# Patient Record
Sex: Male | Born: 1950 | Race: White | Hispanic: No | Marital: Married | Smoking: Former smoker
Health system: Southern US, Community
[De-identification: ages and names within clinical notes are randomized; demographics above are authoritative.]

## PROBLEM LIST (undated history)

## (undated) DIAGNOSIS — Z860100 Personal history of colon polyps, unspecified: Secondary | ICD-10-CM

## (undated) DIAGNOSIS — F419 Anxiety disorder, unspecified: Secondary | ICD-10-CM

## (undated) DIAGNOSIS — K219 Gastro-esophageal reflux disease without esophagitis: Secondary | ICD-10-CM

## (undated) DIAGNOSIS — E785 Hyperlipidemia, unspecified: Secondary | ICD-10-CM

## (undated) DIAGNOSIS — T7840XA Allergy, unspecified, initial encounter: Secondary | ICD-10-CM

## (undated) HISTORY — DX: Anxiety disorder, unspecified: F41.9

## (undated) HISTORY — DX: Gastro-esophageal reflux disease without esophagitis: K21.9

## (undated) HISTORY — PX: CATARACT EXTRACTION W/ INTRAOCULAR LENS  IMPLANT, BILATERAL: SHX1307

## (undated) HISTORY — PX: OTHER SURGICAL HISTORY: SHX169

## (undated) HISTORY — DX: Hyperlipidemia, unspecified: E78.5

## (undated) HISTORY — DX: Personal history of colonic polyps: Z86.010

## (undated) HISTORY — DX: Personal history of colon polyps, unspecified: Z86.0100

## (undated) HISTORY — DX: Allergy, unspecified, initial encounter: T78.40XA

## (undated) HISTORY — PX: VASECTOMY: SHX75

## (undated) HISTORY — PX: EYE SURGERY: SHX253

## (undated) HISTORY — PX: SKIN CANCER EXCISION: SHX779

## (undated) HISTORY — PX: CYST EXCISION: SHX5701

---

## 2005-09-20 ENCOUNTER — Encounter: Payer: Self-pay | Admitting: Internal Medicine

## 2005-12-07 LAB — HM COLONOSCOPY

## 2005-12-18 ENCOUNTER — Encounter: Payer: Self-pay | Admitting: Internal Medicine

## 2008-07-04 ENCOUNTER — Ambulatory Visit: Payer: Self-pay | Admitting: Internal Medicine

## 2008-07-04 DIAGNOSIS — K219 Gastro-esophageal reflux disease without esophagitis: Secondary | ICD-10-CM | POA: Insufficient documentation

## 2008-07-04 DIAGNOSIS — E785 Hyperlipidemia, unspecified: Secondary | ICD-10-CM | POA: Insufficient documentation

## 2008-07-04 DIAGNOSIS — F411 Generalized anxiety disorder: Secondary | ICD-10-CM | POA: Insufficient documentation

## 2008-07-04 DIAGNOSIS — Z860100 Personal history of colon polyps, unspecified: Secondary | ICD-10-CM | POA: Insufficient documentation

## 2008-07-04 DIAGNOSIS — G479 Sleep disorder, unspecified: Secondary | ICD-10-CM | POA: Insufficient documentation

## 2008-07-05 LAB — CONVERTED CEMR LAB
AST: 28 units/L (ref 0–37)
Alkaline Phosphatase: 62 units/L (ref 39–117)
BUN: 12 mg/dL (ref 6–23)
Basophils Absolute: 0 10*3/uL (ref 0.0–0.1)
Basophils Relative: 0.5 % (ref 0.0–3.0)
Bilirubin, Direct: 0.1 mg/dL (ref 0.0–0.3)
CO2: 31 meq/L (ref 19–32)
Calcium: 9.1 mg/dL (ref 8.4–10.5)
Chloride: 104 meq/L (ref 96–112)
Cholesterol: 151 mg/dL (ref 0–200)
Creatinine, Ser: 1 mg/dL (ref 0.4–1.5)
Eosinophils Absolute: 0.1 10*3/uL (ref 0.0–0.7)
GFR calc non Af Amer: 81 mL/min
Glucose, Bld: 95 mg/dL (ref 70–99)
HDL: 23.1 mg/dL — ABNORMAL LOW (ref >39.0)
LDL Cholesterol: 101 mg/dL — ABNORMAL HIGH (ref 0–99)
Lymphocytes Relative: 34.9 % (ref 12.0–46.0)
MCHC: 34.1 g/dL (ref 30.0–36.0)
MCV: 94.1 fL (ref 78.0–100.0)
Neutrophils Relative %: 56.5 % (ref 43.0–77.0)
Platelets: 360 10*3/uL (ref 150–400)
RBC: 4.47 M/uL (ref 4.22–5.81)
RDW: 12.6 % (ref 11.5–14.6)
Total Bilirubin: 0.8 mg/dL (ref 0.3–1.2)
VLDL: 27 mg/dL (ref 0–40)

## 2010-02-20 ENCOUNTER — Ambulatory Visit: Payer: Self-pay | Admitting: Internal Medicine

## 2010-02-21 LAB — CONVERTED CEMR LAB
Glucose, Bld: 96 mg/dL (ref 70–99)
PSA: 0.6 ng/mL (ref 0.10–4.00)

## 2011-01-06 NOTE — Assessment & Plan Note (Signed)
Summary: CPX / LFW   Vital Signs:  Patient profile:   60 year old male Height:      68 inches Weight:      228 pounds BMI:     34.79 Temp:     98.4 degrees F oral Pulse rate:   68 / minute Pulse rhythm:   regular BP sitting:   140 / 80  (left arm) Cuff size:   large  Vitals Entered By: Mervin Hack CMA Duncan Dull) (February 20, 2010 1:55 PM) CC: adult physical   History of Present Illness: Doing fine Has noticed a cyst--started looking like a blackhead--now looks like a cyst No drainage No pain    Allergies (verified): No Known Drug Allergies  Past History:  Past medical, surgical, family and social histories (including risk factors) reviewed for relevance to current acute and chronic problems.  Past Medical History: Reviewed history from 07/04/2008 and no changes required. Anxiety GERD Colonic polyps, hx of Hyperlipidemia--mild  Past Surgical History: Reviewed history from 07/04/2008 and no changes required. 1992 Vasectomy  Family History: Reviewed history from 07/04/2008 and no changes required. Dad died @77   stroke Mom needed pacemaker, HTN 1 brother, 1 sister Breast cancer in sister No colon or prostate cancer No DM, CAD  Social History: Reviewed history from 07/04/2008 and no changes required. 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 Is noisy but no apparent apnea Wears seat belt. Eyes:  Denies blurring and vision loss-1 eye. ENT:  Complains of decreased hearing and ringing in ears; stable hearing problems teeth okay---regular with dentist. CV:  Denies chest pain or discomfort, difficulty breathing at night, difficulty breathing while lying down, fainting, lightheadness, palpitations, and shortness of breath with exertion. Resp:  Denies cough and shortness of breath. GI:  Complains of  indigestion; denies abdominal pain, bloody stools, change in bowel habits, and dark tarry stools; indigestion has been better lately--no meds needed. GU:  Complains of erectile dysfunction and nocturia; denies urinary frequency and urinary hesitancy; nocturia x 1 in general Mild ED. MS:  Denies joint pain and joint swelling. Derm:  Denies lesion(s) and rash; cyst--see HPI. Neuro:  Complains of numbness and tingling; denies headaches and weakness; occ numbness and tingling in big toes---resolves quickly. Psych:  Complains of anxiety; denies depression; gets anxiety a couple of times per week--no more than 2 hours at a time. Has found ways "to push through it". Heme:  Denies abnormal bruising and enlarge lymph nodes. Allergy:  Denies seasonal allergies and sneezing.  Physical Exam  General:  alert and normal appearance.   Eyes:  pupils equal, pupils round, pupils reactive to light, and no optic disk abnormalities.   Ears:  R ear normal and L ear normal.   Mouth:  no erythema and no lesions.   Neck:  supple, no masses, no thyromegaly, no carotid bruits, and no cervical lymphadenopathy.   Lungs:  normal respiratory effort and normal breath sounds.   Heart:  normal rate, regular rhythm, no murmur, and no gallop.   Abdomen:  soft and non-tender.   Rectal:  no hemorrhoids and no masses.   Prostate:  no gland enlargement and no nodules.   Msk:  no joint tenderness and no joint swelling.   Pulses:  normal in feet Extremities:  no edema Neurologic:  alert & oriented X3, strength normal in all extremities, and gait normal.   Skin:  small cyst in lower thoracic back Axillary Nodes:  No palpable lymphadenopathy Psych:  normally interactive, good eye contact, not anxious appearing, and not depressed appearing.     Impression & Recommendations:  Problem # 1:  PREVENTIVE HEALTH CARE (ICD-V70.0) Assessment Comment Only doing well exercising now will check glucose and PSA will try again to get  colonoscopy records---next year will be 5 years so he will need repeat done then regardless  Problem # 2:  GERD (ICD-530.81) Assessment: Improved no meds  Other Orders: TLB-Glucose, QUANT (82947-GLU) Venipuncture (16109) TLB-PSA (Prostate Specific Antigen) (84153-PSA)  Patient Instructions: 1)  Please schedule a follow-up appointment in 1 year.   Prior Medications: None Current Allergies (reviewed today): No known allergies

## 2011-01-06 NOTE — Procedures (Signed)
Summary: Colonoscopy by Dr.Morton [facility]  Colonoscopy by Dr.Morton [facility]   Imported By: Beau Fanny 02/20/2010 15:11:00  _____________________________________________________________________  External Attachment:    Type:   Image     Comment:   External Document

## 2012-11-22 ENCOUNTER — Encounter: Payer: Self-pay | Admitting: Internal Medicine

## 2012-11-23 ENCOUNTER — Ambulatory Visit (INDEPENDENT_AMBULATORY_CARE_PROVIDER_SITE_OTHER): Payer: BC Managed Care – PPO | Admitting: Internal Medicine

## 2012-11-23 ENCOUNTER — Encounter: Payer: Self-pay | Admitting: Internal Medicine

## 2012-11-23 VITALS — BP 140/80 | HR 64 | Temp 98.6°F | Ht 68.0 in | Wt 206.0 lb

## 2012-11-23 DIAGNOSIS — Z23 Encounter for immunization: Secondary | ICD-10-CM

## 2012-11-23 DIAGNOSIS — E785 Hyperlipidemia, unspecified: Secondary | ICD-10-CM

## 2012-11-23 DIAGNOSIS — Z0000 Encounter for general adult medical examination without abnormal findings: Secondary | ICD-10-CM | POA: Insufficient documentation

## 2012-11-23 LAB — CBC WITH DIFFERENTIAL/PLATELET
Basophils Relative: 0.8 % (ref 0.0–3.0)
Eosinophils Absolute: 0.1 10*3/uL (ref 0.0–0.7)
Eosinophils Relative: 1.3 % (ref 0.0–5.0)
HCT: 43.7 % (ref 39.0–52.0)
Lymphs Abs: 1.9 10*3/uL (ref 0.7–4.0)
MCHC: 33.5 g/dL (ref 30.0–36.0)
MCV: 94.5 fl (ref 78.0–100.0)
Monocytes Absolute: 0.4 10*3/uL (ref 0.1–1.0)
Platelets: 299 10*3/uL (ref 150.0–400.0)
RBC: 4.62 Mil/uL (ref 4.22–5.81)
WBC: 5.7 10*3/uL (ref 4.5–10.5)

## 2012-11-23 LAB — BASIC METABOLIC PANEL
BUN: 14 mg/dL (ref 6–23)
CO2: 31 mEq/L (ref 19–32)
Chloride: 103 mEq/L (ref 96–112)
Creatinine, Ser: 0.9 mg/dL (ref 0.4–1.5)
Potassium: 4.8 mEq/L (ref 3.5–5.1)

## 2012-11-23 LAB — HEPATIC FUNCTION PANEL
ALT: 21 U/L (ref 0–53)
AST: 23 U/L (ref 0–37)
Albumin: 4.3 g/dL (ref 3.5–5.2)
Total Protein: 7.2 g/dL (ref 6.0–8.3)

## 2012-11-23 LAB — LIPID PANEL
HDL: 33.6 mg/dL — ABNORMAL LOW (ref >39.00)
Total CHOL/HDL Ratio: 5
Triglycerides: 105 mg/dL (ref 0.0–149.0)

## 2012-11-23 NOTE — Addendum Note (Signed)
Addended by: Sueanne Margarita on: 11/23/2012 12:39 PM   Modules accepted: Orders

## 2012-11-23 NOTE — Assessment & Plan Note (Signed)
Has lost over 20# Will recheck labs

## 2012-11-23 NOTE — Assessment & Plan Note (Signed)
Has gotten in better shape Needs colonoscopy but he wants to check with wife about where to go Will do PSA after discussion Flu vaccine

## 2012-11-23 NOTE — Progress Notes (Signed)
Subjective:    Patient ID: Leroy Harness Sr., male    DOB: 1950-12-18, 61 y.o.   MRN: [id number]  HPI Here for physical Has lost 22# since last visit---eating better and doing more exercise  No new concerns other than cyst on right neck Has gotten a little bigger but has been there for many years  No current outpatient prescriptions on file prior to visit.    No Known Allergies  Past Medical History  Diagnosis Date  . Anxiety   . GERD (gastroesophageal reflux disease)   . Hyperlipidemia   . Personal history of colonic polyps     Past Surgical History  Procedure Date  . Vasectomy     Family History  Problem Relation Age of Onset  . Stroke Father   . Cancer Sister     breast cancer  . CAD Neg Hx   . Diabetes Neg Hx     History   Social History  . Marital Status: Married    Spouse Name: N/A    Number of Children: 4  . Years of Education: N/A   Occupational History  . truss designer    Social History Main Topics  . Smoking status: Former Smoker    Types: Cigarettes    Quit date: 12/07/2005  . Smokeless tobacco: Never Used  . Alcohol Use: Yes  . Drug Use: No  . Sexually Active: Not on file   Other Topics Concern  . Not on file   Social History Narrative  . No narrative on file   Review of Systems  Constitutional: Negative for fatigue.       Wears seat belt  HENT: Positive for hearing loss, rhinorrhea, postnasal drip and tinnitus. Negative for dental problem.        Mild hearing loss Regular with dentist Some allergy symptoms--no meds  Eyes: Negative for visual disturbance.       No diplopia or unilateral vision loss  Respiratory: Negative for cough, chest tightness and shortness of breath.   Cardiovascular: Negative for chest pain, palpitations and leg swelling.  Gastrointestinal: Negative for nausea, vomiting, abdominal pain, constipation and blood in stool.       Occ heartburn---uses tums ~twice a month  Genitourinary: Negative for urgency,  frequency and difficulty urinating.       Occ sl dribbling No sexual problems  Musculoskeletal: Negative for back pain, joint swelling and arthralgias.  Skin: Negative for rash.       No suspicious lesions  Neurological: Positive for numbness. Negative for dizziness, syncope, weakness, light-headedness and headaches.       Slight numbness in great toes---intermittent  Hematological: Negative for adenopathy. Does not bruise/bleed easily.  Psychiatric/Behavioral: Positive for sleep disturbance. Negative for dysphoric mood. The patient is not nervous/anxious.        Chronic sleep problems-- usually no daytime somnolence though. Awakens and its fidgety---relates to stress No meds and doesn't want any       Objective:   Physical Exam  Constitutional: He is oriented to person, place, and time. He appears well-developed and well-nourished. No distress.  HENT:  Head: Normocephalic and atraumatic.  Right Ear: External ear normal.  Left Ear: External ear normal.  Mouth/Throat: Oropharynx is clear and moist. No oropharyngeal exudate.  Eyes: Conjunctivae normal and EOM are normal. Pupils are equal, round, and reactive to light.  Neck: Normal range of motion. Neck supple. No thyromegaly present.  Cardiovascular: Normal rate, regular rhythm, normal heart sounds and intact distal pulses.  Exam reveals no gallop.   No murmur heard. Pulmonary/Chest: Effort normal and breath sounds normal. No respiratory distress. He has no wheezes. He has no rales.  Abdominal: Soft. There is no tenderness.  Musculoskeletal: He exhibits no edema and no tenderness.  Lymphadenopathy:    He has no cervical adenopathy.  Neurological: He is alert and oriented to person, place, and time.  Skin: No rash noted. No erythema.  Psychiatric: He has a normal mood and affect. His behavior is normal.          Assessment & Plan:

## 2012-11-23 NOTE — Patient Instructions (Signed)
You need to have a repeat colonoscopy because of your precancerous polyps on the last one. Please let me know when you decide where you want to go (I can set this up if you need me to)

## 2012-11-28 ENCOUNTER — Encounter: Payer: Self-pay | Admitting: *Deleted

## 2013-04-21 DIAGNOSIS — K635 Polyp of colon: Secondary | ICD-10-CM | POA: Insufficient documentation

## 2013-07-07 ENCOUNTER — Encounter: Payer: Self-pay | Admitting: Internal Medicine

## 2013-07-07 ENCOUNTER — Ambulatory Visit (INDEPENDENT_AMBULATORY_CARE_PROVIDER_SITE_OTHER): Payer: BC Managed Care – PPO | Admitting: Internal Medicine

## 2013-07-07 VITALS — BP 120/70 | HR 66 | Temp 98.1°F | Wt 203.0 lb

## 2013-07-07 DIAGNOSIS — L723 Sebaceous cyst: Secondary | ICD-10-CM

## 2013-07-07 NOTE — Assessment & Plan Note (Signed)
Now bigger and slight discomfort Wants it excised I discussed that this can easily be an office procedure but felt more comfortable referring to a surgeon given its location Will send to [facility]

## 2013-07-07 NOTE — Progress Notes (Signed)
  Subjective:    Patient ID: Leroy Harness Sr., male    DOB: 07/03/1951, 62 y.o.   MRN: [id number]  HPI The right neck cyst is bothering him more People notice Will nick it at times shaving  No red or inflamed No tenderness  No current outpatient prescriptions on file prior to visit.   No current facility-administered medications on file prior to visit.    No Known Allergies  Past Medical History  Diagnosis Date  . Anxiety   . GERD (gastroesophageal reflux disease)   . Hyperlipidemia   . Personal history of colonic polyps     Past Surgical History  Procedure Laterality Date  . Vasectomy      Family History  Problem Relation Age of Onset  . Stroke Father   . Cancer Sister     breast cancer  . CAD Neg Hx   . Diabetes Neg Hx     History   Social History  . Marital Status: Married    Spouse Name: N/A    Number of Children: 4  . Years of Education: N/A   Occupational History  . truss designer    Social History Main Topics  . Smoking status: Former Smoker    Types: Cigarettes    Quit date: 12/07/2005  . Smokeless tobacco: Never Used  . Alcohol Use: Yes  . Drug Use: No  . Sexually Active: Not on file   Other Topics Concern  . Not on file   Social History Narrative  . No narrative on file   Review of Systems Has lost 3# more Feels well No fever    Objective:   Physical Exam  Constitutional: He appears well-developed and well-nourished. No distress.  Neck:  2.5-3 cm right neck cyst--overlying SCM body Fixed to skin but not underlying tissues No inflammation          Assessment & Plan:

## 2013-07-24 ENCOUNTER — Ambulatory Visit (INDEPENDENT_AMBULATORY_CARE_PROVIDER_SITE_OTHER): Payer: BC Managed Care – PPO | Admitting: General Surgery

## 2013-07-24 ENCOUNTER — Encounter: Payer: Self-pay | Admitting: General Surgery

## 2013-07-24 VITALS — BP 130/72 | HR 74 | Resp 12 | Ht 68.0 in | Wt 207.0 lb

## 2013-07-24 DIAGNOSIS — L723 Sebaceous cyst: Secondary | ICD-10-CM

## 2013-07-24 DIAGNOSIS — L729 Follicular cyst of the skin and subcutaneous tissue, unspecified: Secondary | ICD-10-CM | POA: Insufficient documentation

## 2013-07-24 NOTE — Progress Notes (Signed)
Patient ID: Leroy Harness Sr., male   DOB: 1951-01-11, 62 y.o.   MRN: [id number]  Chief Complaint  Patient presents with  . Cyst    HPI Leroy HARI Sr. is a 62 y.o. male referred here by Dr Alphonsus Sias for a cyst on his neck. He first noticed this about 3 years ago. It has been enlarging in the last 6 months. He reports no pain from the cyst on the right side of his neck. HPI  Past Medical History  Diagnosis Date  . Anxiety   . GERD (gastroesophageal reflux disease)   . Hyperlipidemia   . Personal history of colonic polyps     Past Surgical History  Procedure Laterality Date  . Vasectomy    . Cyst excision  30 years ago    from back    Family History  Problem Relation Age of Onset  . Stroke Father   . Breast cancer Sister     breast cancer  . CAD Neg Hx   . Diabetes Neg Hx     Social History History  Substance Use Topics  . Smoking status: Former Smoker    Types: Cigarettes    Quit date: 12/07/2005  . Smokeless tobacco: Never Used  . Alcohol Use: Yes    Allergies  Allergen Reactions  . Shellfish Allergy     No current outpatient prescriptions on file.   No current facility-administered medications for this visit.    Review of Systems Review of Systems  Constitutional: Negative.   Respiratory: Negative.   Cardiovascular: Negative.     Blood pressure 130/72, pulse 74, resp. rate 12, height 5\' 8"  (1.727 m), weight 207 lb (93.895 kg).  Physical Exam Physical Exam  Constitutional: He is oriented to person, place, and time. He appears well-developed and well-nourished.  Neck: Neck supple.  2 cm cutaneous cyst right side of neck posterior. Firm and mildly tender. No redness or skin induration.  Lymphadenopathy:    He has no cervical adenopathy.  Neurological: He is alert and oriented to person, place, and time.    Data Reviewed none  Assessment    Enlarging symptomatic skin cyst     Plan    Recommend excision. Discussed fully with patient.  Patient in agreement.     Procedure: Excision skin cyst right lateral neck area. Prep: chloroprep Anesthetic: 1% xylocaine mixed with 0.5% marcaine, 10ml used.   After the cyst area was draped out, transverse elliptical incision made and the cyst excised out fully from underlying subcutaneous tissue.  Bleeding controlled with disposable cautery.  Closed with 3-0 vicryl in sunbcutaneous tissue. Skin closed woth 4-0 nylon interrupted stitches. Dressed with neosporin, telfa and tegaderm. No immediate problems from procedure.    Shavonta Gossen G 07/26/2013, 6:22 AM

## 2013-07-24 NOTE — Patient Instructions (Addendum)
Cyst Removal Your caregiver has removed a cyst. A cyst is a sac containing a semi-solid material. Cysts may occur any place on your body. They may remain small for years or gradually get larger. A sebaceous cyst is an enlarged (dilated) sweat gland filled with old sweat (sebum). Unattended, these may become large (the size of a softball) over several years time. These are often removed for improved appearance (cosmetic) reasons or before they become infected to form an abscess. An abscess is an infected cyst. HOME CARE INSTRUCTIONS   Keep your bandage clean and dry. You may change your bandage after 24 hours. If your bandage sticks, use warm water to gently loosen it. Pat the area dry with a clean towel before putting on another bandage.  If possible, keep the area where the cyst was removed raised to relieve soreness, swelling, and promote healing.  If you have stitches, keep them clean and dry.  You may clean your stitches gently with a cotton swab dipped in warm soapy water.  Do not soak the area where the cyst was removed or go swimming. You may shower.  Do not overuse the area where your cyst was removed.  Return in 7 days or as directed to have your stitches removed.  Take medicines as instructed by your caregiver. SEEK IMMEDIATE MEDICAL CARE IF:   An oral temperature above 102 F (38.9 C) develops, not controlled by medication.  Blood continues to soak through the bandage.  You have increasing pain in the area where your cyst was removed.  You have redness, swelling, pus, a bad smell, soreness (inflammation), or red streaks coming away from the stitches. These are signs of infection. MAKE SURE YOU:   Understand these instructions.  Will watch your condition.  Will get help right away if you are not doing well or get worse. Document Released: 11/20/2000 Document Revised: 02/15/2012 Document Reviewed: 03/15/2008 [facility] Patient Information 2014 [place], [place].  Ice pack  applied. Written instructions provided to patient regarding wound care.  Patient advised that he will be contacted by phone when the pathology report is available.  Follow up appointment has been scheduled with the nurse in 7-10 days.

## 2013-07-26 ENCOUNTER — Encounter: Payer: Self-pay | Admitting: General Surgery

## 2013-07-26 LAB — PATHOLOGY

## 2013-07-31 ENCOUNTER — Ambulatory Visit: Payer: BC Managed Care – PPO | Admitting: *Deleted

## 2013-07-31 DIAGNOSIS — L729 Follicular cyst of the skin and subcutaneous tissue, unspecified: Secondary | ICD-10-CM

## 2013-07-31 NOTE — Patient Instructions (Signed)
The sutures were removed and steri strips applied to the right side of his neck. Looks clean and healing well.

## 2013-10-12 ENCOUNTER — Other Ambulatory Visit: Payer: Self-pay

## 2013-12-24 ENCOUNTER — Emergency Department: Payer: Self-pay | Admitting: Emergency Medicine

## 2014-01-19 ENCOUNTER — Ambulatory Visit (INDEPENDENT_AMBULATORY_CARE_PROVIDER_SITE_OTHER): Payer: PRIVATE HEALTH INSURANCE | Admitting: Internal Medicine

## 2014-01-19 ENCOUNTER — Encounter: Payer: Self-pay | Admitting: Internal Medicine

## 2014-01-19 VITALS — BP 128/60 | HR 54 | Temp 98.4°F | Ht 68.0 in | Wt 191.0 lb

## 2014-01-19 DIAGNOSIS — G479 Sleep disorder, unspecified: Secondary | ICD-10-CM

## 2014-01-19 DIAGNOSIS — Z0000 Encounter for general adult medical examination without abnormal findings: Secondary | ICD-10-CM

## 2014-01-19 NOTE — Assessment & Plan Note (Signed)
Healthy Has really done well with fitness and weight loss Will defer labs to next year

## 2014-01-19 NOTE — Progress Notes (Signed)
Pre-visit discussion using our clinic review tool. No additional management support is needed unless otherwise documented below in the visit note.  

## 2014-01-19 NOTE — Progress Notes (Signed)
Subjective:    Patient ID: Leroy Givens Sr., male    DOB: 1951/10/01, 63 y.o.   MRN: [id number]  HPI Here for physical  Has continued to work on lifestyle Now running 6-7 miles per week Trying to eat right Has lost another 16#  Had colonoscopy in May Had some small polyps  Still has a couple of spots his wife is concerned about  No current outpatient prescriptions on file prior to visit.   No current facility-administered medications on file prior to visit.    Allergies  Allergen Reactions  . Shellfish Allergy     Past Medical History  Diagnosis Date  . Anxiety   . GERD (gastroesophageal reflux disease)   . Hyperlipidemia   . Personal history of colonic polyps     Past Surgical History  Procedure Laterality Date  . Vasectomy    . Cyst excision  30 years ago    from back    Family History  Problem Relation Age of Onset  . Stroke Father   . Breast cancer Sister     breast cancer  . CAD Neg Hx   . Diabetes Neg Hx     History   Social History  . Marital Status: Married    Spouse Name: N/A    Number of Children: 4  . Years of Education: N/A   Occupational History  . truss designer    Social History Main Topics  . Smoking status: Former Smoker    Types: Cigarettes    Quit date: 12/07/2005  . Smokeless tobacco: Never Used  . Alcohol Use: Yes  . Drug Use: No  . Sexual Activity: Not on file   Other Topics Concern  . Not on file   Social History Narrative  . No narrative on file   Review of Systems  Constitutional: Negative for fatigue.       Wears seat belt  HENT: Positive for hearing loss and tinnitus.        Mild hearing loss Regular with dentist  Eyes: Positive for visual disturbance.       No diplopia or unilateral vision loss Thinks his cataracts are worse  Respiratory: Negative for cough, chest tightness and shortness of breath.   Cardiovascular: Negative for chest pain, palpitations and leg swelling.  Gastrointestinal:  Negative for nausea, vomiting, abdominal pain, constipation and blood in stool.       No heartburn  Endocrine: Negative for cold intolerance and heat intolerance.  Genitourinary: Positive for difficulty urinating. Negative for urgency and frequency.       Slight dribbling only Mild ED  Musculoskeletal: Negative for arthralgias, back pain and joint swelling.  Skin: Negative for rash.  Allergic/Immunologic: Positive for environmental allergies. Negative for immunocompromised state.       Mild allergy symptoms --no meds  Neurological: Positive for numbness. Negative for dizziness, syncope, weakness, light-headedness and headaches.       Some numbness in both great toes  Hematological: Negative for adenopathy. Does not bruise/bleed easily.  Psychiatric/Behavioral: Positive for sleep disturbance. Negative for dysphoric mood. The patient is nervous/anxious.        Chronic sleep problems---slightly better. No usual daytime somnolence Some anxiety---like from stress with company being bought out       Objective:   Physical Exam  Constitutional: He is oriented to person, place, and time. He appears well-developed and well-nourished. No distress.  HENT:  Head: Normocephalic and atraumatic.  Right Ear: External ear normal.  Left Ear:  External ear normal.  Mouth/Throat: Oropharynx is clear and moist. No oropharyngeal exudate.  Eyes: Conjunctivae and EOM are normal. Pupils are equal, round, and reactive to light.  Neck: Normal range of motion. Neck supple. No thyromegaly present.  Cardiovascular: Normal rate, regular rhythm, normal heart sounds and intact distal pulses.  Exam reveals no gallop.   No murmur heard. Pulmonary/Chest: Effort normal and breath sounds normal. No respiratory distress. He has no wheezes. He has no rales.  Abdominal: Soft. There is no tenderness.  Musculoskeletal: He exhibits no edema and no tenderness.  Lymphadenopathy:    He has no cervical adenopathy.  Neurological:  He is alert and oriented to person, place, and time.  Skin: No rash noted. No erythema.  Cherry angiomas, seb keratoses, tags  Psychiatric: He has a normal mood and affect. His behavior is normal.          Assessment & Plan:

## 2014-01-19 NOTE — Assessment & Plan Note (Signed)
Has improved Discussed melatonin prn

## 2015-01-24 ENCOUNTER — Ambulatory Visit (INDEPENDENT_AMBULATORY_CARE_PROVIDER_SITE_OTHER): Payer: PPO | Admitting: Internal Medicine

## 2015-01-24 ENCOUNTER — Encounter: Payer: Self-pay | Admitting: Internal Medicine

## 2015-01-24 VITALS — BP 136/82 | HR 48 | Temp 98.2°F | Ht 68.0 in | Wt 191.8 lb

## 2015-01-24 DIAGNOSIS — Z23 Encounter for immunization: Secondary | ICD-10-CM

## 2015-01-24 DIAGNOSIS — Z0000 Encounter for general adult medical examination without abnormal findings: Secondary | ICD-10-CM

## 2015-01-24 DIAGNOSIS — K219 Gastro-esophageal reflux disease without esophagitis: Secondary | ICD-10-CM

## 2015-01-24 DIAGNOSIS — R351 Nocturia: Secondary | ICD-10-CM

## 2015-01-24 DIAGNOSIS — G479 Sleep disorder, unspecified: Secondary | ICD-10-CM

## 2015-01-24 DIAGNOSIS — Z7189 Other specified counseling: Secondary | ICD-10-CM | POA: Insufficient documentation

## 2015-01-24 MED ORDER — ZOSTER VACCINE LIVE 19400 UNT/0.65ML ~~LOC~~ SOLR: 0.6500 mL | Freq: Once | SUBCUTANEOUS | Status: DC

## 2015-01-24 NOTE — Assessment & Plan Note (Signed)
Has not seemed to have any problems lately

## 2015-01-24 NOTE — Assessment & Plan Note (Signed)
This has been quiet Rarely needs the tums

## 2015-01-24 NOTE — Assessment & Plan Note (Addendum)
I have personally reviewed the Medicare Annual Wellness questionnaire and have noted 1. The patient's medical and social history 2. Their use of alcohol, tobacco or illicit drugs 3. Their current medications and supplements 4. The patient's functional ability including ADL's, fall risks, home safety risks and hearing or visual             impairment. 5. Diet and physical activities 6. Evidence for depression or mood disorders  The patients weight, height, BMI and visual acuity have been recorded in the chart I have made referrals, counseling and provided education to the patient based review of the above and I have provided the pt with a written personalized care plan for preventive services.  I have provided you with a copy of your personalized plan for preventive services. Please take the time to review along with your updated medication list.  Will give prevnar Recommended flu vaccine in fall UTD with colonoscopy Will check PSA if covered Rx for zostavax

## 2015-01-24 NOTE — Progress Notes (Signed)
Pre visit review using our clinic review tool, if applicable. No additional management support is needed unless otherwise documented below in the visit note. 

## 2015-01-24 NOTE — Assessment & Plan Note (Signed)
See social history 

## 2015-01-24 NOTE — Progress Notes (Signed)
Subjective:    Patient ID: Leroy Givens Sr., male    DOB: 11-19-1951, 64 y.o.   MRN: [id number]  HPI Here for Medicare wellness visit and follow up of chronic issues Reviewed incomplete form and advanced directives Sees eye doctor and dentist-- Dr Delice Bison No falls No depression or anhedonia Vision is fine--better since cataracts Mild hearing loss--slow decline Independent in instrumental ADLs Just had cataracts done--no other procedures No medications No sig memory problems No tobacco Drinks 3-4 beers per week Still runs and goes to gym  No recent problems with anxiety  GERD has been fairly quiet Will use occasional tums No regular heartburn No swallowing problems No chest pain No SOB No dizziness or syncope  No sig problems with voiding Stream okay Nocturia is variable No sig ED  Sleeps okay No meds for this now  No current outpatient prescriptions on file prior to visit.   No current facility-administered medications on file prior to visit.    Allergies  Allergen Reactions  . Shellfish Allergy     Past Medical History  Diagnosis Date  . Anxiety   . GERD (gastroesophageal reflux disease)   . Hyperlipidemia   . Personal history of colonic polyps     Past Surgical History  Procedure Laterality Date  . Vasectomy    . Cyst excision  30 years ago    from back  . Cataract extraction w/ intraocular lens  implant, bilateral  12/15-R  1/16-- L    Family History  Problem Relation Age of Onset  . Stroke Father   . Breast cancer Sister     breast cancer  . CAD Neg Hx   . Diabetes Neg Hx     History   Social History  . Marital Status: Married    Spouse Name: N/A  . Number of Children: 4  . Years of Education: N/A   Occupational History  . 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 Smoking status: Former Smoker    Types: Cigarettes    Quit date: 12/07/2005  . Smokeless tobacco: Never  Used  . Alcohol Use: Yes  . Drug Use: No  . Sexual Activity: Not on file   Other Topics Concern  . Not on file   Social History Narrative   No living will   Working on health care POA--- would want wife   Would accept resuscitation   No tube feeds if cognitively unaware   Review of Systems Appetite is fine Did have his weight down to 175#--- now up some with the winter, vacation, etc Bowels are fine Teeth are fine No skin problems or rash    Objective:   Physical Exam  Constitutional: He is oriented to person, place, and time. He appears well-developed and well-nourished. No distress.  HENT:  Mouth/Throat: Oropharynx is clear and moist. No oropharyngeal exudate.  Neck: Normal range of motion. Neck supple. No thyromegaly present.  Cardiovascular: Normal rate, regular rhythm, normal heart sounds and intact distal pulses.  Exam reveals no gallop.   No murmur heard. Pulmonary/Chest: Effort normal and breath sounds normal. No respiratory distress. He has no wheezes. He has no rales.  Abdominal: Soft. There is no tenderness.  Musculoskeletal: He exhibits no edema or tenderness.  Lymphadenopathy:    He has no cervical adenopathy.  Neurological: He is alert and oriented to person, place, and time.  President-- "Obama, Tawni Pummel, Clinton" [phone] D-l-r-o-w Recall  3/3  Skin: No rash noted. No erythema.  Psychiatric: He has a normal mood and affect. His behavior is normal.          Assessment & Plan:

## 2015-01-25 LAB — GLUCOSE, RANDOM: Glucose, Bld: 84 mg/dL (ref 70–99)

## 2015-01-25 LAB — PSA: PSA: 0.49 ng/mL (ref 0.10–4.00)

## 2015-01-28 NOTE — Addendum Note (Signed)
Addended by: Jacqualin Combes on: 01/28/2015 08:24 AM   Modules accepted: Orders

## 2015-03-05 ENCOUNTER — Telehealth: Payer: Self-pay

## 2015-03-05 NOTE — Telephone Encounter (Signed)
Voice mailbox not set up, no way to leave message about flu vaccine

## 2016-04-29 ENCOUNTER — Ambulatory Visit (INDEPENDENT_AMBULATORY_CARE_PROVIDER_SITE_OTHER): Payer: PPO

## 2016-04-29 VITALS — BP 140/78 | HR 48 | Temp 98.4°F | Ht 67.5 in | Wt 197.5 lb

## 2016-04-29 DIAGNOSIS — Z125 Encounter for screening for malignant neoplasm of prostate: Secondary | ICD-10-CM | POA: Diagnosis not present

## 2016-04-29 DIAGNOSIS — Z1159 Encounter for screening for other viral diseases: Secondary | ICD-10-CM

## 2016-04-29 DIAGNOSIS — Z23 Encounter for immunization: Secondary | ICD-10-CM | POA: Diagnosis not present

## 2016-04-29 DIAGNOSIS — Z0000 Encounter for general adult medical examination without abnormal findings: Secondary | ICD-10-CM

## 2016-04-29 DIAGNOSIS — Z8639 Personal history of other endocrine, nutritional and metabolic disease: Secondary | ICD-10-CM

## 2016-04-29 NOTE — Patient Instructions (Signed)
Mr. Folan , Thank you for taking time to come for your Medicare Wellness Visit. I appreciate your ongoing commitment to your health goals. Please review the following plan we discussed and let me know if I can assist you in the future.   These are the goals we discussed: Goals    . Weight < 180 lb (81.647 kg)     Starting 04/29/2016, I will continue to eat 3 meals with snacks, minimize consumption of greasy, fried foods, limit intake of red meat and pork, limit intake of simple carbs, increase intake of fruits and vegetables, and try to stop eating after 8PM in an effort to lose 15-20 lbs.        This is a list of the screening recommended for you and due dates:  Health Maintenance  Topic Date Due  . Flu Shot  07/07/2016  . Colon Cancer Screening  04/22/2023  . Tetanus Vaccine  01/01/2024  . Shingles Vaccine  Completed  .  Hepatitis C: One time screening is recommended by Center for Disease Control  (CDC) for  adults born from [birth year] through 1967.   Completed  . Pneumonia vaccines  Completed   Preventive Care for Adults  A healthy lifestyle and preventive care can promote health and wellness. Preventive health guidelines for adults include the following key practices.  . A routine yearly physical is a good way to check with your health care provider about your health and preventive screening. It is a chance to share any concerns and updates on your health and to receive a thorough exam.  . Visit your dentist for a routine exam and preventive care every 6 months. Brush your teeth twice a day and floss once a day. Good oral hygiene prevents tooth decay and gum disease.  . The frequency of eye exams is based on your age, health, family medical history, use  of contact lenses, and other factors. Follow your health care provider's ecommendations for frequency of eye exams.  . Eat a healthy diet. Foods like vegetables, fruits, whole grains, low-fat dairy products, and lean protein foods  contain the nutrients you need without too many calories. Decrease your intake of foods high in solid fats, added sugars, and salt. Eat the right amount of calories for you. Get information about a proper diet from your health care provider, if necessary.  . Regular physical exercise is one of the most important things you can do for your health. Most adults should get at least 150 minutes of moderate-intensity exercise (any activity that increases your heart rate and causes you to sweat) each week. In addition, most adults need muscle-strengthening exercises on 2 or more days a week.  Silver Sneakers may be a benefit available to you. To determine eligibility, you may visit the website: www.silversneakers.com or contact program at 972-038-7063 Mon-Fri between 8AM-8PM.   . Maintain a healthy weight. The body mass index (BMI) is a screening tool to identify possible weight problems. It provides an estimate of body fat based on height and weight. Your health care provider can find your BMI and can help you achieve or maintain a healthy weight.   For adults 20 years and older: ? A BMI below 18.5 is considered underweight. ? A BMI of 18.5 to 24.9 is normal. ? A BMI of 25 to 29.9 is considered overweight. ? A BMI of 30 and above is considered obese.   . Maintain normal blood lipids and cholesterol levels by exercising and minimizing your intake  of saturated fat. Eat a balanced diet with plenty of fruit and vegetables. Blood tests for lipids and cholesterol should begin at age 62 and be repeated every 5 years. If your lipid or cholesterol levels are high, you are over 50, or you are at high risk for heart disease, you may need your cholesterol levels checked more frequently. Ongoing high lipid and cholesterol levels should be treated with medicines if diet and exercise are not working.  . If you smoke, find out from your health care provider how to quit. If you do not use tobacco, please do not  start.  . If you choose to drink alcohol, please do not consume more than 2 drinks per day. One drink is considered to be 12 ounces (355 mL) of beer, 5 ounces (148 mL) of wine, or 1.5 ounces (44 mL) of liquor.  . If you are 52-35 years old, ask your health care provider if you should take aspirin to prevent strokes.  . Use sunscreen. Apply sunscreen liberally and repeatedly throughout the day. You should seek shade when your shadow is shorter than you. Protect yourself by wearing long sleeves, pants, a wide-brimmed hat, and sunglasses year round, whenever you are outdoors.  . Once a month, do a whole body skin exam, using a mirror to look at the skin on your back. Tell your health care provider of new moles, moles that have irregular borders, moles that are larger than a pencil eraser, or moles that have changed in shape or color.

## 2016-04-29 NOTE — Progress Notes (Signed)
PCP notes:  Health maintenance:  PPSV23 - administered Hep C screening - completed  Abnormal screenings:  Hearing - failed. Please assess pt at next appt.   Patient concerns: None  Nurse concerns: None  Next PCP appt: None.    I reviewed health advisor's note, was available for consultation on the day of service listed in this note, and agree with documentation and plan. Elsie Stain, MD.

## 2016-04-29 NOTE — Progress Notes (Signed)
Subjective:   Leroy SAITTA Sr. is a 65 y.o. male who presents for Medicare Annual/Subsequent preventive examination.  Review of Systems:  N/A Cardiac Risk Factors include: advanced age (>2men, >63 women);male gender     Objective:    Vitals: BP 140/78 mmHg  Pulse 48  Temp(Src) 98.4 F (36.9 C) (Oral)  Ht 5' 7.5" (1.715 m)  Wt 197 lb 8 oz (89.585 kg)  BMI 30.46 kg/m2  SpO2 99%  Body mass index is 30.46 kg/(m^2).  Tobacco History  Smoking status  . Former Smoker  . Types: Cigarettes  . Quit date: 12/07/2005  Smokeless tobacco  . Never Used     Counseling given: No   Past Medical History  Diagnosis Date  . Anxiety   . GERD (gastroesophageal reflux disease)   . Hyperlipidemia   . Personal history of colonic polyps    Past Surgical History  Procedure Laterality Date  . Vasectomy    . Cyst excision  30 years ago    from back  . Cataract extraction w/ intraocular lens  implant, bilateral  12/15-R  1/16-- L   Family History  Problem Relation Age of Onset  . Stroke Father   . Breast cancer Sister     breast cancer  . CAD Neg Hx   . Diabetes Neg Hx    History  Sexual Activity  . Sexual Activity: Yes    Outpatient Encounter Prescriptions as of 04/29/2016  Medication Sig  . [DISCONTINUED] zoster vaccine live, PF, (ZOSTAVAX) 60454 UNT/0.65ML injection Inject 19,400 Units into the skin once.   No facility-administered encounter medications on file as of 04/29/2016.    Activities of Daily Living In your present state of health, do you have any difficulty performing the following activities: 04/29/2016  Hearing? Y  Vision? N  Difficulty concentrating or making decisions? N  Walking or climbing stairs? N  Dressing or bathing? N  Doing errands, shopping? N  Preparing Food and eating ? N  Using the Toilet? N  In the past six months, have you accidently leaked urine? N  Do you have problems with loss of bowel control? N  Managing your Medications? N    Managing your Finances? N  Housekeeping or managing your Housekeeping? N    Patient Care Team: Venia Carbon, MD as PCP - General (Internal Medicine) Seeplaputhur Robinette Haines, MD (General Surgery) Bryson Ha, OD as Referring Physician (Optometry)   Assessment:     Hearing Screening   125Hz  250Hz  500Hz  1000Hz  2000Hz  4000Hz  8000Hz   Right ear:   0 0 40 0   Left ear:   40 0 40 40   Vision Screening Comments: Last eye exam with Dr. Kerin Ransom in Feb 2017   Exercise Activities and Dietary recommendations Current Exercise Habits: Home exercise routine, Type of exercise: Other - see comments;strength training/weights (running), Time (Minutes): 60, Frequency (Times/Week): 7, Weekly Exercise (Minutes/Week): 420, Intensity: Moderate, Exercise limited by: None identified  Goals    . Weight < 180 lb (81.647 kg)     Starting 04/29/2016, I will continue to eat 3 meals with snacks, minimize consumption of greasy, fried foods, limit intake of red meat and pork, limit intake of simple carbs, increase intake of fruits and vegetables, and try to stop eating after 8PM in an effort to lose 15-20 lbs.       Fall Risk Fall Risk  04/29/2016 01/24/2015  Falls in the past year? No Yes  Number falls in past yr: -  1  Injury with Fall? - No   Depression Screen PHQ 2/9 Scores 04/29/2016 01/24/2015  PHQ - 2 Score 0 0    Cognitive Testing MMSE - Mini Mental State Exam 04/29/2016  Orientation to time 5  Orientation to Place 5  Registration 3  Attention/ Calculation 0  Recall 3  Language- name 2 objects 0  Language- repeat 1  Language- follow 3 step command 3  Language- read & follow direction 0  Write a sentence 0  Copy design 0  Total score 20   PLEASE NOTE: A Mini-Cog screen was completed. Maximum score is 20. A value of 0 denotes this part of Folstein MMSE was not completed or the patient failed this part of the Mini-Cog screening.   Mini-Cog Screening Orientation to Time - Max 5 pts Orientation  to Place - Max 5 pts Registration - Max 3 pts Recall - Max 3 pts Language Repeat - Max 1 pts Language Follow 3 Step Command - Max 3 pts   Immunization History  Administered Date(s) Administered  . Influenza, Seasonal, Injecte, Preservative Fre 11/23/2012  . Pneumococcal Conjugate-13 01/24/2015  . Pneumococcal Polysaccharide-23 04/29/2016  . Td 07/04/2008, 12/31/2013  . Zoster 02/08/2015   Screening Tests Health Maintenance  Topic Date Due  . INFLUENZA VACCINE  07/07/2016  . COLONOSCOPY  04/22/2023  . TETANUS/TDAP  01/01/2024  . ZOSTAVAX  Completed  . Hepatitis C Screening  Completed  . PNA vac Low Risk Adult  Completed      Plan:     I have personally reviewed and addressed the Medicare Annual Wellness questionnaire and have noted the following in the patient's chart:  A. Medical and social history B. Use of alcohol, tobacco or illicit drugs  C. Current medications and supplements D. Functional ability and status E.  Nutritional status F.  Physical activity G. Advance directives H. List of other physicians I.  Hospitalizations, surgeries, and ER visits in previous 12 months J.  [place] to include hearing, vision, cognitive, depression L. Referrals and appointments - none  In addition, I have reviewed and discussed with patient certain preventive protocols, quality metrics, and best practice recommendations. A written personalized care plan for preventive services as well as general preventive health recommendations were provided to patient.  See attached scanned questionnaire for additional information.   Signed,   Lindell Noe, MHA, BS, LPN Health Advisor

## 2016-04-29 NOTE — Progress Notes (Signed)
Pre visit review using our clinic review tool, if applicable. No additional management support is needed unless otherwise documented below in the visit note. 

## 2016-04-30 ENCOUNTER — Ambulatory Visit: Payer: PPO

## 2016-04-30 LAB — HEPATITIS C ANTIBODY: HCV Ab: NEGATIVE

## 2016-04-30 LAB — PSA, MEDICARE: PSA: 0.42 ng/ml (ref 0.10–4.00)

## 2016-04-30 LAB — GLUCOSE, RANDOM: Glucose, Bld: 100 mg/dL — ABNORMAL HIGH (ref 70–99)

## 2016-10-22 ENCOUNTER — Ambulatory Visit (INDEPENDENT_AMBULATORY_CARE_PROVIDER_SITE_OTHER): Payer: PPO

## 2016-10-22 DIAGNOSIS — Z23 Encounter for immunization: Secondary | ICD-10-CM

## 2017-01-18 ENCOUNTER — Telehealth: Payer: Self-pay | Admitting: Internal Medicine

## 2017-01-18 NOTE — Telephone Encounter (Signed)
Patient Name: CARRY MCREA DOB: 1951/10/16 Initial Comment Caller states c/o diarrhea, abdominal cramping, chills and headache. Nurse Assessment Nurse: Ronnald Ramp, RN, Miranda Date/Time (Eastern Time): 01/18/2017 12:22:11 PM Confirm and document reason for call. If symptomatic, describe symptoms. ---Caller states Friday he started having diarrhea, cramping, headache, and chills. Denies vomiting. Has not check his temp. Does the patient have any new or worsening symptoms? ---Yes Will a triage be completed? ---Yes Related visit to physician within the last 2 weeks? ---No Does the PT have any chronic conditions? (i.e. diabetes, asthma, etc.) ---No Is this a behavioral health or substance abuse call? ---No Guidelines Guideline Title Affirmed Question Affirmed Notes Diarrhea [1] MODERATE diarrhea (e.g., 4-6 times / day more than normal) AND [2] present > 48 hours (2 days) Final Disposition User See Physician within 24 Hours Ronnald Ramp, RN, Miranda Comments Appt schedule with Dr. Silvio Pate tomorrow at 12pm. Referrals REFERRED TO PCP OFFICE Disagree/Comply: Comply

## 2017-01-18 NOTE — Telephone Encounter (Signed)
Pt has appt 01/19/17 at 12noon with Dr Silvio Pate.

## 2017-01-19 ENCOUNTER — Encounter: Payer: Self-pay | Admitting: Internal Medicine

## 2017-01-19 ENCOUNTER — Ambulatory Visit (INDEPENDENT_AMBULATORY_CARE_PROVIDER_SITE_OTHER): Payer: PPO | Admitting: Internal Medicine

## 2017-01-19 VITALS — BP 134/74 | HR 68 | Temp 99.4°F | Wt 198.0 lb

## 2017-01-19 DIAGNOSIS — K529 Noninfective gastroenteritis and colitis, unspecified: Secondary | ICD-10-CM

## 2017-01-19 NOTE — Progress Notes (Signed)
   Subjective:    Patient ID: Leroy Givens Sr., male    DOB: [birth year], 66 y.o.   MRN: [id number]  HPI Here due to diarrhea  Took cruise to [place] Started feeling funny 4 days ago--then started with loose stool Mild malaise Flew back the next day 2 days ago-- the diarrhea really hit (every 2 hours) No N/V  Some fever Chills and sweats at night-- still last night No abdominal pain today---some yesterday (points to epigastrium) Discomfort did improve after BM Still going every 2 hours or so  No current outpatient prescriptions on file prior to visit.   No current facility-administered medications on file prior to visit.     Allergies  Allergen Reactions  . Shellfish Allergy     Past Medical History:  Diagnosis Date  . Anxiety   . GERD (gastroesophageal reflux disease)   . Hyperlipidemia   . Personal history of colonic polyps     Past Surgical History:  Procedure Laterality Date  . CATARACT EXTRACTION W/ INTRAOCULAR LENS  IMPLANT, BILATERAL  12/15-R  1/16-- L  . CYST EXCISION  30 years ago   from back  . VASECTOMY      Family History  Problem Relation Age of Onset  . Stroke Father   . Breast cancer Sister     breast cancer  . CAD Neg Hx   . Diabetes Neg Hx     Social History   Social History  . Marital status: Married    Spouse name: N/A  . Number of children: 4  . Years of education: N/A   Occupational History  . 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 Smoking status: Former Smoker    Types: Cigarettes    Quit date: 12/07/2005  . Smokeless tobacco: Never Used  . Alcohol use Yes  . Drug use: No  . Sexual activity: Yes   Other Topics Concern  . Not on file   Social History Narrative   No living will   Working on health care POA--- would want wife   Would accept resuscitation   No tube feeds if cognitively unaware   Review of Systems No appetite but ate some Able to keep up with  fluids Some vertigo and headache (sinus) No orthostatic dizziness No cough or SOB Has aching    Objective:   Physical Exam  Constitutional: He appears well-nourished. No distress.  Neck: No thyromegaly present.  Cardiovascular: Normal rate, regular rhythm and normal heart sounds.  Exam reveals no gallop.   No murmur heard. HR ~62  Pulmonary/Chest: Effort normal and breath sounds normal. No respiratory distress. He has no wheezes. He has no rales.  Abdominal: Soft. Bowel sounds are normal. He exhibits no distension. There is no tenderness. There is no rebound and no guarding.  Lymphadenopathy:    He has no cervical adenopathy.          Assessment & Plan:

## 2017-01-19 NOTE — Progress Notes (Signed)
Pre visit review using our clinic review tool, if applicable. No additional management support is needed unless otherwise documented below in the visit note. 

## 2017-01-19 NOTE — Assessment & Plan Note (Signed)
Likely viral and time course not really consistent with traveler's diarrhea No dehydration Discussed symptomatic Rx (no imodium if viral) Discussed self limited time course

## 2017-05-27 ENCOUNTER — Ambulatory Visit (INDEPENDENT_AMBULATORY_CARE_PROVIDER_SITE_OTHER): Payer: PPO | Admitting: Internal Medicine

## 2017-05-27 ENCOUNTER — Encounter: Payer: Self-pay | Admitting: Internal Medicine

## 2017-05-27 VITALS — BP 122/70 | HR 52 | Temp 98.0°F | Ht 68.0 in | Wt 196.0 lb

## 2017-05-27 DIAGNOSIS — K219 Gastro-esophageal reflux disease without esophagitis: Secondary | ICD-10-CM

## 2017-05-27 DIAGNOSIS — G479 Sleep disorder, unspecified: Secondary | ICD-10-CM | POA: Diagnosis not present

## 2017-05-27 DIAGNOSIS — Z0000 Encounter for general adult medical examination without abnormal findings: Secondary | ICD-10-CM | POA: Diagnosis not present

## 2017-05-27 DIAGNOSIS — Z7189 Other specified counseling: Secondary | ICD-10-CM | POA: Diagnosis not present

## 2017-05-27 NOTE — Progress Notes (Signed)
Subjective:    Patient ID: Leroy Givens Sr., male    DOB: 1951-01-13, 66 y.o.   MRN: [id number]  HPI Here for Medicare wellness visit and follow up of chronic health conditions Reviewed form and advanced directives Reviewed other doctors Occasional drink or alcohol No tobacco  Runs regularly-- 10 miles per week and goes to gym for weights Vision is fine Hearing is good--mild tinnitus No falls No depression or anhedonia Independent with instrumental ADLs  He has no new concerns Still works full time---no thoughts of retirement  No recent problems with GERD Rarely will take a medication No dysphagia  Chronic sleep problems Has slept well for years Snores but no apnea Awakens refreshed and no daytime somnolence  No current outpatient prescriptions on file prior to visit.   No current facility-administered medications on file prior to visit.     Allergies  Allergen Reactions  . Other Other (See Comments)    " Fresh " seafood... Nausea, dyspnea    Past Medical History:  Diagnosis Date  . Anxiety   . GERD (gastroesophageal reflux disease)   . Hyperlipidemia   . Personal history of colonic polyps     Past Surgical History:  Procedure Laterality Date  . CATARACT EXTRACTION W/ INTRAOCULAR LENS  IMPLANT, BILATERAL  12/15-R  1/16-- L  . CYST EXCISION  30 years ago   from back  . VASECTOMY      Family History  Problem Relation Age of Onset  . Stroke Father   . Breast cancer Sister        breast cancer  . CAD Neg Hx   . Diabetes Neg Hx     Social History   Social History  . Marital status: Married    Spouse name: N/A  . Number of children: 4  . Years of education: N/A   Occupational History  . 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 Smoking status: Former Smoker    Types: Cigarettes    Quit date: 12/07/2005  . Smokeless tobacco: Never Used  . Alcohol use Yes  . Drug use: No  . Sexual  activity: Yes   Other Topics Concern  . Not on file   Social History Narrative   No living will   Working on health care POA--- would want wife   Would accept resuscitation   No tube feeds if cognitively unaware   Review of Systems Appetite is good Weight stable Teeth are okay. Keeps up with dentist --- Dr Fritz Pickerel ? in [place] Wears seat belt Bowels are fine--no blood Urine is fine--normal stream. Nocturia x 1 generally Some facial rash--- has keratosis on left flank (flaky) No sig back or joint pain    Objective:   Physical Exam  Constitutional: He is oriented to person, place, and time. He appears well-nourished. No distress.  HENT:  Mouth/Throat: Oropharynx is clear and moist. No oropharyngeal exudate.  Neck: No thyromegaly present.  Cardiovascular: Normal rate, regular rhythm, normal heart sounds and intact distal pulses.  Exam reveals no gallop.   No murmur heard. Pulmonary/Chest: Effort normal and breath sounds normal. No respiratory distress. He has no wheezes. He has no rales.  Abdominal: Soft. There is no tenderness.  Musculoskeletal: He exhibits no edema or tenderness.  Lymphadenopathy:    He has no cervical adenopathy.  Neurological: He is alert and oriented to person, place, and time.  President--- "Sheela Stack--- Obama"  [id number] D-l-r-o-w Recall 3/3  Skin: No rash noted. No erythema.  seb keratosis left flank  Psychiatric: He has a normal mood and affect. His behavior is normal.          Assessment & Plan:

## 2017-05-27 NOTE — Assessment & Plan Note (Signed)
Thinks he may have done this---will check at home

## 2017-05-27 NOTE — Assessment & Plan Note (Signed)
Rare symptoms

## 2017-05-27 NOTE — Assessment & Plan Note (Signed)
I have personally reviewed the Medicare Annual Wellness questionnaire and have noted 1. The patient's medical and social history 2. Their use of alcohol, tobacco or illicit drugs 3. Their current medications and supplements 4. The patient's functional ability including ADL's, fall risks, home safety risks and hearing or visual             impairment. 5. Diet and physical activities 6. Evidence for depression or mood disorders  The patients weight, height, BMI and visual acuity have been recorded in the chart I have made referrals, counseling and provided education to the patient based review of the above and I have provided the pt with a written personalized care plan for preventive services.  I have provided you with a copy of your personalized plan for preventive services. Please take the time to review along with your updated medication list.  Will defer PSA and consider last one next year Colon due 2019--last 2014 and polyps Works out regularly Annual flu vaccine

## 2017-05-27 NOTE — Assessment & Plan Note (Signed)
Chronic but no daytime issues Discussed melatonin if worsens

## 2018-02-03 DIAGNOSIS — H524 Presbyopia: Secondary | ICD-10-CM | POA: Diagnosis not present

## 2018-05-30 ENCOUNTER — Encounter: Payer: Self-pay | Admitting: Internal Medicine

## 2018-05-30 ENCOUNTER — Ambulatory Visit (INDEPENDENT_AMBULATORY_CARE_PROVIDER_SITE_OTHER): Payer: PPO | Admitting: Internal Medicine

## 2018-05-30 VITALS — BP 140/88 | HR 61 | Temp 98.0°F | Ht 67.75 in | Wt 209.0 lb

## 2018-05-30 DIAGNOSIS — Z125 Encounter for screening for malignant neoplasm of prostate: Secondary | ICD-10-CM | POA: Diagnosis not present

## 2018-05-30 DIAGNOSIS — S86911A Strain of unspecified muscle(s) and tendon(s) at lower leg level, right leg, initial encounter: Secondary | ICD-10-CM

## 2018-05-30 DIAGNOSIS — Z0000 Encounter for general adult medical examination without abnormal findings: Secondary | ICD-10-CM | POA: Diagnosis not present

## 2018-05-30 DIAGNOSIS — K219 Gastro-esophageal reflux disease without esophagitis: Secondary | ICD-10-CM | POA: Diagnosis not present

## 2018-05-30 DIAGNOSIS — Z7189 Other specified counseling: Secondary | ICD-10-CM

## 2018-05-30 NOTE — Assessment & Plan Note (Addendum)
I have personally reviewed the Medicare Annual Wellness questionnaire and have noted 1. The patient's medical and social history 2. Their use of alcohol, tobacco or illicit drugs 3. Their current medications and supplements 4. The patient's functional ability including ADL's, fall risks, home safety risks and hearing or visual             impairment. 5. Diet and physical activities 6. Evidence for depression or mood disorders  The patients weight, height, BMI and visual acuity have been recorded in the chart I have made referrals, counseling and provided education to the patient based review of the above and I have provided the pt with a written personalized care plan for preventive services.  I have provided you with a copy of your personalized plan for preventive services. Please take the time to review along with your updated medication list.  Yearly flu vaccine Will consider shingrix over time Colon in August Discussed PSA--- will check

## 2018-05-30 NOTE — Progress Notes (Signed)
Hearing Screening   Method: Audiometry   125Hz  250Hz  500Hz  1000Hz  2000Hz  3000Hz  4000Hz  6000Hz  8000Hz   Right ear:   40 40 40  40    Left ear:   40 40 0  0    Vision Screening Comments: February 2019

## 2018-05-30 NOTE — Assessment & Plan Note (Signed)
See social history 

## 2018-05-30 NOTE — Progress Notes (Signed)
Subjective:    Patient ID: Leroy Givens Sr., male    DOB: 10-Dec-1950, 67 y.o.   MRN: [id number]  HPI Here for Medicare wellness visit and follow up of chronic health conditions Reviewed form and advanced directives Reviewed other doctors Still has occasional drink of alcohol No tobacco Exercises regularly still Vision and hearing are fine No falls No depression or anhedonia Independent with instrumental ADLs No sig memory issues  Injured his right knee several weeks ago--when running Took misstep and it has been hurting Some improvement----but not sure Has backed off --only doing elliptical now Takes ibuprofen occasionally ----- 600mg . This does help  No regular heartburn No dysphagia  Ongoing sleep issues Knee pain has affected him Same chronic issues but no daytime somnolence  No current outpatient medications on file prior to visit.   No current facility-administered medications on file prior to visit.     Allergies  Allergen Reactions  . Other Other (See Comments)    " Fresh " seafood... Nausea, dyspnea    Past Medical History:  Diagnosis Date  . Anxiety   . GERD (gastroesophageal reflux disease)   . Hyperlipidemia   . Personal history of colonic polyps     Past Surgical History:  Procedure Laterality Date  . CATARACT EXTRACTION W/ INTRAOCULAR LENS  IMPLANT, BILATERAL  12/15-R  1/16-- L  . CYST EXCISION  30 years ago   from back  . VASECTOMY      Family History  Problem Relation Age of Onset  . Stroke Father   . Breast cancer Sister        breast cancer  . CAD Neg Hx   . Diabetes Neg Hx     Social History   Socioeconomic History  . Marital status: Married    Spouse name: Not on file  . Number of children: 4  . Years of education: Not on file  . Highest education level: Not on file  Occupational History  . Occupation: Automotive engineer    Comment: [employer]  Social Needs  . Financial resource strain:  Not on file  . Food insecurity:    Worry: Not on file    Inability: Not on file  . Transportation needs:    Medical: Not on file    Non-medical: Not on file  Tobacco Use  . Smoking status: Former Smoker    Types: Cigarettes    Last attempt to quit: 12/07/2005    Years since quitting: 12.4  . Smokeless tobacco: Never Used  Substance and Sexual Activity  . Alcohol use: Yes  . Drug use: No  . Sexual activity: Yes  Lifestyle  . Physical activity:    Days per week: Not on file    Minutes per session: Not on file  . Stress: Not on file  Relationships  . Social connections:    Talks on phone: Not on file    Gets together: Not on file    Attends religious service: Not on file    Active member of club or organization: Not on file    Attends meetings of clubs or organizations: Not on file    Relationship status: Not on file  . Intimate partner violence:    Fear of current or ex partner: Not on file    Emotionally abused: Not on file    Physically abused: Not on file    Forced sexual activity: Not on file  Other Topics Concern  . Not  on file  Social History Narrative   No living will   Working on health care POA--- would want wife, then son Leroy Morton   Would accept resuscitation   No tube feeds if cognitively unaware   Review of Systems Has gained some weight since last year--up 13# Appetite is fine Wears seat belt Teeth fine--keeps up with dentist Some red splotches on right cheek--- there for several months Bowels are okay. No blood. Is set for colonoscopy in August Voids fine. No major urgency, or trouble with stream No other joint problems No chest pain, palpitations, SOB No dizziness or syncope    Objective:   Physical Exam  Constitutional: He is oriented to person, place, and time. He appears well-developed. No distress.  HENT:  Mouth/Throat: Oropharynx is clear and moist. No oropharyngeal exudate.  Neck: No thyromegaly present.  Cardiovascular: Normal rate,  regular rhythm and intact distal pulses. Exam reveals no gallop.  ?slight systolic murmur at apex  Respiratory: Effort normal and breath sounds normal. No respiratory distress. He has no wheezes. He has no rales.  GI: Soft. There is no tenderness.  Musculoskeletal: He exhibits no edema.  Right knee without swelling No ligament or meniscus findings No crepitus  Lymphadenopathy:    He has no cervical adenopathy.  Neurological: He is alert and oriented to person, place, and time.  President--- "Milinda Pointer, Obama, ?" [phone] D-l-r-o-w Recall 3/3  Skin: No rash noted. No erythema.  Psychiatric: He has a normal mood and affect. His behavior is normal.           Assessment & Plan:

## 2018-05-30 NOTE — Assessment & Plan Note (Signed)
No ligament or meniscus findings Discussed slowly increasing activity Consider PT I don't think he should need ortho

## 2018-05-30 NOTE — Assessment & Plan Note (Signed)
Rare symptoms

## 2018-05-31 LAB — CBC
HCT: 43.4 % (ref 39.0–52.0)
Hemoglobin: 14.5 g/dL (ref 13.0–17.0)
MCHC: 33.5 g/dL (ref 30.0–36.0)
MCV: 93 fl (ref 78.0–100.0)
Platelets: 481 K/uL — ABNORMAL HIGH (ref 150.0–400.0)
RBC: 4.66 Mil/uL (ref 4.22–5.81)
RDW: 14 % (ref 11.5–15.5)
WBC: 6.1 K/uL (ref 4.0–10.5)

## 2018-05-31 LAB — COMPREHENSIVE METABOLIC PANEL
ALBUMIN: 4.5 g/dL (ref 3.5–5.2)
ALT: 13 U/L (ref 0–53)
AST: 18 U/L (ref 0–37)
Alkaline Phosphatase: 61 U/L (ref 39–117)
BILIRUBIN TOTAL: 0.5 mg/dL (ref 0.2–1.2)
BUN: 17 mg/dL (ref 6–23)
CALCIUM: 9.5 mg/dL (ref 8.4–10.5)
CO2: 32 mEq/L (ref 19–32)
CREATININE: 1.11 mg/dL (ref 0.40–1.50)
Chloride: 103 mEq/L (ref 96–112)
GFR: 69.8 mL/min (ref >60.00)
Glucose, Bld: 103 mg/dL — ABNORMAL HIGH (ref 70–99)
Potassium: 5.2 mEq/L — ABNORMAL HIGH (ref 3.5–5.1)
Sodium: 142 mEq/L (ref 135–145)
Total Protein: 7.1 g/dL (ref 6.0–8.3)

## 2018-05-31 LAB — PSA, MEDICARE: PSA: 0.66 ng/mL (ref 0.10–4.00)

## 2018-09-14 DIAGNOSIS — D122 Benign neoplasm of ascending colon: Secondary | ICD-10-CM | POA: Diagnosis not present

## 2018-09-14 DIAGNOSIS — K621 Rectal polyp: Secondary | ICD-10-CM | POA: Diagnosis not present

## 2018-09-14 DIAGNOSIS — D123 Benign neoplasm of transverse colon: Secondary | ICD-10-CM | POA: Diagnosis not present

## 2018-09-14 DIAGNOSIS — Z86010 Personal history of colonic polyps: Secondary | ICD-10-CM | POA: Diagnosis not present

## 2018-09-14 DIAGNOSIS — K5730 Diverticulosis of large intestine without perforation or abscess without bleeding: Secondary | ICD-10-CM | POA: Diagnosis not present

## 2018-09-14 DIAGNOSIS — D125 Benign neoplasm of sigmoid colon: Secondary | ICD-10-CM | POA: Diagnosis not present

## 2018-09-16 DIAGNOSIS — D126 Benign neoplasm of colon, unspecified: Secondary | ICD-10-CM | POA: Diagnosis not present

## 2018-09-16 DIAGNOSIS — K621 Rectal polyp: Secondary | ICD-10-CM | POA: Diagnosis not present

## 2018-09-16 DIAGNOSIS — D122 Benign neoplasm of ascending colon: Secondary | ICD-10-CM | POA: Diagnosis not present

## 2018-09-16 DIAGNOSIS — K5730 Diverticulosis of large intestine without perforation or abscess without bleeding: Secondary | ICD-10-CM | POA: Diagnosis not present

## 2018-09-16 DIAGNOSIS — Z86010 Personal history of colonic polyps: Secondary | ICD-10-CM | POA: Diagnosis not present

## 2018-09-16 DIAGNOSIS — D123 Benign neoplasm of transverse colon: Secondary | ICD-10-CM | POA: Diagnosis not present

## 2018-09-16 DIAGNOSIS — D125 Benign neoplasm of sigmoid colon: Secondary | ICD-10-CM | POA: Diagnosis not present

## 2018-09-16 DIAGNOSIS — Z1211 Encounter for screening for malignant neoplasm of colon: Secondary | ICD-10-CM | POA: Diagnosis not present

## 2018-09-21 ENCOUNTER — Encounter: Payer: Self-pay | Admitting: Internal Medicine

## 2019-02-11 DIAGNOSIS — H5213 Myopia, bilateral: Secondary | ICD-10-CM | POA: Diagnosis not present

## 2019-02-11 DIAGNOSIS — H524 Presbyopia: Secondary | ICD-10-CM | POA: Diagnosis not present

## 2019-02-11 DIAGNOSIS — H52223 Regular astigmatism, bilateral: Secondary | ICD-10-CM | POA: Diagnosis not present

## 2019-06-05 ENCOUNTER — Encounter: Payer: PPO | Admitting: Internal Medicine

## 2019-06-22 ENCOUNTER — Encounter: Payer: PPO | Admitting: Internal Medicine

## 2019-10-27 ENCOUNTER — Other Ambulatory Visit: Payer: Self-pay

## 2019-11-01 ENCOUNTER — Telehealth: Payer: Self-pay

## 2019-11-01 ENCOUNTER — Other Ambulatory Visit: Payer: Self-pay

## 2019-11-01 DIAGNOSIS — Z20822 Contact with and (suspected) exposure to covid-19: Secondary | ICD-10-CM

## 2019-11-01 NOTE — Telephone Encounter (Signed)
Leroy Morton (DPR not signed) said pt was exposed to scretary who had positive covid test on 10/30/19. Pt wears mask on and off at work and Leroy Morton is not sure of social distancing.I spoke with pt and he said I could speak with his wife but pt answered covid screening questions and pt does have head congestion and muscle pain across shoulders for 2 days; H/A, dry cough, & sinus drainage. Pt will go to [facility] visitor entrance for covid testing shortly; I did advise pt he should self quarantine until gets covid results and then further instructions will be given. UC & ED precautions given Pt and pts wife voiced understanding. FYI to Dr Silvio Pate.

## 2019-11-02 LAB — NOVEL CORONAVIRUS, NAA: SARS-CoV-2, NAA: DETECTED — AB

## 2019-11-03 NOTE — Telephone Encounter (Signed)
Needs to be put into our COVID follow up protocol and make sure health department got results (should be done through the testing site). If he is asymptomatic, just needs to maintain 14 days quarantine

## 2019-11-04 ENCOUNTER — Telehealth: Payer: Self-pay | Admitting: Nurse Practitioner

## 2019-11-04 NOTE — Telephone Encounter (Signed)
Attempt made to call patient and follow-up on Covid positive status + discuss current symptoms and discuss possible infusion clinic treatment with Bamlanivimab which he may qualify for based on age and current chronic disease diagnosis.  No answer and mailbox not set up.

## 2019-11-06 ENCOUNTER — Encounter: Payer: PPO | Admitting: Internal Medicine

## 2019-11-06 NOTE — Telephone Encounter (Signed)
Spoke to pt. He tested positive. Low grade fever. No shortness of breath. No cough. Some nasal congestion.   He lives in [place]. I will contact the health department.  Pt is aware to stay in quarantine up to 10 days after onset. 24 hours no fever with no medications.

## 2019-11-15 NOTE — Telephone Encounter (Signed)
Spoke to pt. He has felt good since last week. Health Dept was contacted from [facility]. They cleared him last Thursday.

## 2019-11-27 ENCOUNTER — Other Ambulatory Visit: Payer: Self-pay

## 2019-11-27 ENCOUNTER — Ambulatory Visit (INDEPENDENT_AMBULATORY_CARE_PROVIDER_SITE_OTHER): Payer: PPO | Admitting: Internal Medicine

## 2019-11-27 ENCOUNTER — Encounter: Payer: Self-pay | Admitting: Internal Medicine

## 2019-11-27 VITALS — BP 122/80 | HR 64 | Temp 97.8°F | Ht 67.75 in | Wt 191.0 lb

## 2019-11-27 DIAGNOSIS — D473 Essential (hemorrhagic) thrombocythemia: Secondary | ICD-10-CM | POA: Diagnosis not present

## 2019-11-27 DIAGNOSIS — K219 Gastro-esophageal reflux disease without esophagitis: Secondary | ICD-10-CM

## 2019-11-27 DIAGNOSIS — Z7189 Other specified counseling: Secondary | ICD-10-CM | POA: Diagnosis not present

## 2019-11-27 DIAGNOSIS — Z0000 Encounter for general adult medical examination without abnormal findings: Secondary | ICD-10-CM

## 2019-11-27 DIAGNOSIS — G479 Sleep disorder, unspecified: Secondary | ICD-10-CM | POA: Diagnosis not present

## 2019-11-27 DIAGNOSIS — Z23 Encounter for immunization: Secondary | ICD-10-CM | POA: Diagnosis not present

## 2019-11-27 DIAGNOSIS — D75839 Thrombocytosis, unspecified: Secondary | ICD-10-CM | POA: Insufficient documentation

## 2019-11-27 LAB — COMPREHENSIVE METABOLIC PANEL
ALT: 19 U/L (ref 0–53)
AST: 27 U/L (ref 0–37)
Albumin: 4.5 g/dL (ref 3.5–5.2)
Alkaline Phosphatase: 69 U/L (ref 39–117)
BUN: 20 mg/dL (ref 6–23)
CO2: 29 mEq/L (ref 19–32)
Calcium: 9.6 mg/dL (ref 8.4–10.5)
Chloride: 105 mEq/L (ref 96–112)
Creatinine, Ser: 0.97 mg/dL (ref 0.40–1.50)
GFR: 76.4 mL/min (ref >60.00)
Glucose, Bld: 97 mg/dL (ref 70–99)
Potassium: 4.9 mEq/L (ref 3.5–5.1)
Sodium: 141 mEq/L (ref 135–145)
Total Bilirubin: 0.8 mg/dL (ref 0.2–1.2)
Total Protein: 7.2 g/dL (ref 6.0–8.3)

## 2019-11-27 LAB — CBC WITH DIFFERENTIAL/PLATELET
Basophils Absolute: 0 10*3/uL (ref 0.0–0.1)
Basophils Relative: 0.5 % (ref 0.0–3.0)
Eosinophils Absolute: 0.1 10*3/uL (ref 0.0–0.7)
Eosinophils Relative: 0.7 % (ref 0.0–5.0)
HCT: 43.6 % (ref 39.0–52.0)
Hemoglobin: 14.3 g/dL (ref 13.0–17.0)
Lymphocytes Relative: 18 % (ref 12.0–46.0)
Lymphs Abs: 1.6 10*3/uL (ref 0.7–4.0)
MCHC: 32.8 g/dL (ref 30.0–36.0)
MCV: 94.2 fl (ref 78.0–100.0)
Monocytes Absolute: 0.6 10*3/uL (ref 0.1–1.0)
Monocytes Relative: 6.8 % (ref 3.0–12.0)
Neutro Abs: 6.5 10*3/uL (ref 1.4–7.7)
Neutrophils Relative %: 74 % (ref 43.0–77.0)
Platelets: 651 10*3/uL — ABNORMAL HIGH (ref 150.0–400.0)
RBC: 4.63 Mil/uL (ref 4.22–5.81)
RDW: 13.9 % (ref 11.5–15.5)
WBC: 8.8 10*3/uL (ref 4.0–10.5)

## 2019-11-27 NOTE — Assessment & Plan Note (Signed)
Chronic Some better No meds

## 2019-11-27 NOTE — Assessment & Plan Note (Signed)
I have personally reviewed the Medicare Annual Wellness questionnaire and have noted 1. The patient's medical and social history 2. Their use of alcohol, tobacco or illicit drugs 3. Their current medications and supplements 4. The patient's functional ability including ADL's, fall risks, home safety risks and hearing or visual             impairment. 5. Diet and physical activities 6. Evidence for depression or mood disorders  The patients weight, height, BMI and visual acuity have been recorded in the chart I have made referrals, counseling and provided education to the patient based review of the above and I have provided the pt with a written personalized care plan for preventive services.  I have provided you with a copy of your personalized plan for preventive services. Please take the time to review along with your updated medication list.  Flu vaccine today Had polyps in 2019---repeat due in a few years Will consider 1 last PSA next year Recommended shingrix at pharmacy Stays fit

## 2019-11-27 NOTE — Progress Notes (Signed)
Hearing Screening   Method: Audiometry   125Hz  250Hz  500Hz  1000Hz  2000Hz  3000Hz  4000Hz  6000Hz  8000Hz   Right ear:   20 20 20  20     Left ear:   20 20 20   0    Vision Screening Comments: March 2020

## 2019-11-27 NOTE — Progress Notes (Signed)
Subjective:    Patient ID: Leroy Givens Sr., male    DOB: [birth year], 68 y.o.   MRN: [id number]  HPI Here for Medicare wellness visit and follow up of chronic health conditions Reviewed form and advanced directives Reviewed other doctors No tobacco Occasional beer--several times a week Exercises regularly Vision and hearing are fine No falls No depression or anhedonia Independent with instrumental ADLs No memory issues  Had COVID Mild symptoms only--mostly body aches No chest symptoms  Rare heartburn No meds needed No dysphagia  No significant joint or back pain  Reviewed blood work Did have elevated platelets--no evidence of thrombosis  No current outpatient medications on file prior to visit.   No current facility-administered medications on file prior to visit.    Allergies  Allergen Reactions  . Other Other (See Comments)    " Fresh " seafood... Nausea, dyspnea    Past Medical History:  Diagnosis Date  . Anxiety   . GERD (gastroesophageal reflux disease)   . Hyperlipidemia   . Personal history of colonic polyps     Past Surgical History:  Procedure Laterality Date  . CATARACT EXTRACTION W/ INTRAOCULAR LENS  IMPLANT, BILATERAL  12/15-R  1/16-- L  . CYST EXCISION  30 years ago   from back  . VASECTOMY      Family History  Problem Relation Age of Onset  . Stroke Father   . Breast cancer Sister        breast cancer  . Arthritis Brother   . CAD Neg Hx   . Diabetes Neg Hx     Social History   Socioeconomic History  . Marital status: Married    Spouse name: Not on file  . Number of children: 4  . Years of education: Not on file  . Highest education level: Not on file  Occupational History  . Occupation: Automotive engineer    Comment: [employer]formerly [employer]  Tobacco Use  . Smoking status: Former Smoker    Types: Cigarettes    Quit date: 12/07/2005    Years since quitting: 13.9  . Smokeless tobacco: Never Used    Substance and Sexual Activity  . Alcohol use: Yes  . Drug use: No  . Sexual activity: Yes  Other Topics Concern  . Not on file  Social History Narrative   Has living will   Now with health care POA--- would want wife, then son Gwyndolyn Saxon   Would accept resuscitation   No tube feeds if cognitively unaware   Social Determinants of Health   Financial Resource Strain:   . Difficulty of Paying Living Expenses: Not on file  Food Insecurity:   . Worried About Charity fundraiser in the Last Year: Not on file  . Ran Out of Food in the Last Year: Not on file  Transportation Needs:   . Lack of Transportation (Medical): Not on file  . Lack of Transportation (Non-Medical): Not on file  Physical Activity:   . Days of Exercise per Week: Not on file  . Minutes of Exercise per Session: Not on file  Stress:   . Feeling of Stress : Not on file  Social Connections:   . Frequency of Communication with Friends and Family: Not on file  . Frequency of Social Gatherings with Friends and Family: Not on file  . Attends Religious Services: Not on file  . Active Member of Clubs or Organizations: Not on file  . Attends Archivist  Meetings: Not on file  . Marital Status: Not on file  Intimate Partner Violence:   . Fear of Current or Ex-Partner: Not on file  . Emotionally Abused: Not on file  . Physically Abused: Not on file  . Sexually Abused: Not on file   Review of Systems  Appetite is good Weight back down now with lifestyle Never sleeps that well. No general daytime somnolence. No meds. Better now than 10 years ago Wears seat belt Some gum problems---regular with dentist No rash. Has brown spot for some months on upper chest (seb keratosis) No chest pain or SOB No dizziness or syncope No palpitations or edema Bowels are fine--no blood Urine flow is fairly normal. Nocturia x 1    Objective:   Physical Exam  Constitutional: He is oriented to person, place, and time. He appears  well-developed. No distress.  HENT:  Mouth/Throat: Oropharynx is clear and moist. No oropharyngeal exudate.  Neck: No thyromegaly present.  Cardiovascular: Normal rate, regular rhythm, normal heart sounds and intact distal pulses. Exam reveals no gallop.  No murmur heard. Respiratory: Effort normal and breath sounds normal. No respiratory distress. He has no wheezes. He has no rales.  GI: Soft. There is no abdominal tenderness.  Musculoskeletal:        General: No tenderness or edema.  Lymphadenopathy:    He has no cervical adenopathy.  Neurological: He is alert and oriented to person, place, and time.  President--- "Dwaine Deter, Bush" [phone] D-l-r-o-w Recall 3/3  Skin: No rash noted. No erythema.  Psychiatric: He has a normal mood and affect. His behavior is normal.           Assessment & Plan:

## 2019-11-27 NOTE — Addendum Note (Signed)
Addended by: Pilar Grammes on: 11/27/2019 12:50 PM   Modules accepted: Orders

## 2019-11-27 NOTE — Assessment & Plan Note (Signed)
Rare symptoms

## 2019-11-27 NOTE — Assessment & Plan Note (Signed)
Noted last year Will recheck No clotting issues evident

## 2019-11-27 NOTE — Assessment & Plan Note (Signed)
See social history 

## 2019-11-30 ENCOUNTER — Other Ambulatory Visit: Payer: Self-pay | Admitting: Internal Medicine

## 2019-11-30 DIAGNOSIS — D75839 Thrombocytosis, unspecified: Secondary | ICD-10-CM

## 2019-11-30 DIAGNOSIS — D473 Essential (hemorrhagic) thrombocythemia: Secondary | ICD-10-CM

## 2019-12-12 ENCOUNTER — Encounter: Payer: Self-pay | Admitting: Oncology

## 2019-12-12 ENCOUNTER — Other Ambulatory Visit: Payer: Self-pay

## 2019-12-12 ENCOUNTER — Inpatient Hospital Stay: Payer: PPO | Attending: Oncology | Admitting: Oncology

## 2019-12-12 ENCOUNTER — Inpatient Hospital Stay: Payer: PPO

## 2019-12-12 VITALS — BP 176/76 | HR 58 | Temp 97.9°F | Resp 16 | Ht 67.75 in | Wt 196.2 lb

## 2019-12-12 DIAGNOSIS — D75839 Thrombocytosis, unspecified: Secondary | ICD-10-CM

## 2019-12-12 DIAGNOSIS — K219 Gastro-esophageal reflux disease without esophagitis: Secondary | ICD-10-CM | POA: Diagnosis not present

## 2019-12-12 DIAGNOSIS — D473 Essential (hemorrhagic) thrombocythemia: Secondary | ICD-10-CM

## 2019-12-12 DIAGNOSIS — Z86010 Personal history of colonic polyps: Secondary | ICD-10-CM | POA: Diagnosis not present

## 2019-12-12 DIAGNOSIS — I10 Essential (primary) hypertension: Secondary | ICD-10-CM | POA: Diagnosis not present

## 2019-12-12 DIAGNOSIS — Z87891 Personal history of nicotine dependence: Secondary | ICD-10-CM | POA: Diagnosis not present

## 2019-12-12 LAB — CBC WITH DIFFERENTIAL/PLATELET
Abs Immature Granulocytes: 0.02 10*3/uL (ref 0.00–0.07)
Basophils Absolute: 0.1 10*3/uL (ref 0.0–0.1)
Basophils Relative: 1 %
Eosinophils Absolute: 0.1 10*3/uL (ref 0.0–0.5)
Eosinophils Relative: 1 %
HCT: 41 % (ref 39.0–52.0)
Hemoglobin: 13.2 g/dL (ref 13.0–17.0)
Immature Granulocytes: 0 %
Lymphocytes Relative: 29 %
Lymphs Abs: 2 10*3/uL (ref 0.7–4.0)
MCH: 30.7 pg (ref 26.0–34.0)
MCHC: 32.2 g/dL (ref 30.0–36.0)
MCV: 95.3 fL (ref 80.0–100.0)
Monocytes Absolute: 0.5 10*3/uL (ref 0.1–1.0)
Monocytes Relative: 8 %
Neutro Abs: 4.1 10*3/uL (ref 1.7–7.7)
Neutrophils Relative %: 61 %
Platelets: 600 10*3/uL — ABNORMAL HIGH (ref 150–400)
RBC: 4.3 MIL/uL (ref 4.22–5.81)
RDW: 13.8 % (ref 11.5–15.5)
WBC: 6.7 10*3/uL (ref 4.0–10.5)
nRBC: 0 % (ref 0.0–0.2)

## 2019-12-12 LAB — IRON AND TIBC
Iron: 111 ug/dL (ref 45–182)
Saturation Ratios: 32 % (ref 17.9–39.5)
TIBC: 347 ug/dL (ref 250–450)
UIBC: 236 ug/dL

## 2019-12-12 LAB — FERRITIN: Ferritin: 127 ng/mL (ref 24–336)

## 2019-12-12 LAB — SEDIMENTATION RATE: Sed Rate: 2 mm/hr (ref 0–20)

## 2019-12-12 NOTE — Progress Notes (Signed)
Pt does not feel any different he did state that he had covid on thanksgiving-mostly a HA and sinus pressure. He now is fighting a sinus infection.

## 2019-12-14 ENCOUNTER — Encounter: Payer: Self-pay | Admitting: Oncology

## 2019-12-14 NOTE — Progress Notes (Signed)
Hematology/Oncology Consult note [facility] Telephone:([phone] [phone]  Patient Care Team: Venia Carbon, MD as PCP - General (Internal Medicine) Christene Lye, MD (General Surgery) Bryson Ha, OD as Referring Physician (Optometry)   Name of the patient: Leroy Morton  [id number]  [birth year]    Reason for referral- thrombocytosis   Referring physician- Dr. Silvio Pate  Date of visit: 12/14/19   History of presenting illness-patient is a 69 year old gentleman with a past medical history significant for hypertension and GERD referred to [place] for thrombocytosis. Most recent CBC from 11/27/2019 showed platelet count of 651.  His prior platelet count from 05/30/2018 was 41.  White cell count and hemoglobin have remained normal.  Patient denies any changes in his appetite or unintentional weight loss or night sweats.  Denies any lumps or bumps anywhere.  Denies any prior thromboembolic episodes.  ECOG PS- 1  Pain scale- 0   Review of systems- Review of Systems  Constitutional: Negative for chills, fever, malaise/fatigue and weight loss.  HENT: Negative for congestion, ear discharge and nosebleeds.   Eyes: Negative for blurred vision.  Respiratory: Negative for cough, hemoptysis, sputum production, shortness of breath and wheezing.   Cardiovascular: Negative for chest pain, palpitations, orthopnea and claudication.  Gastrointestinal: Negative for abdominal pain, blood in stool, constipation, diarrhea, heartburn, melena, nausea and vomiting.  Genitourinary: Negative for dysuria, flank pain, frequency, hematuria and urgency.  Musculoskeletal: Negative for back pain, joint pain and myalgias.  Skin: Negative for rash.  Neurological: Negative for dizziness, tingling, focal weakness, seizures, weakness and headaches.  Endo/Heme/Allergies: Does not bruise/bleed easily.  Psychiatric/Behavioral: Negative for depression and suicidal ideas. The  patient does not have insomnia.     Allergies  Allergen Reactions  . Other Other (See Comments)    " Fresh " seafood... Nausea, dyspnea    Patient Active Problem List   Diagnosis Date Noted  . Thrombocytosis ([place]) 11/27/2019  . Advance directive discussed with patient 01/24/2015  . Routine general medical examination at a health care facility 11/23/2012  . GERD 07/04/2008  . Sleep disorder 07/04/2008  . COLONIC POLYPS, HX OF 07/04/2008     Past Medical History:  Diagnosis Date  . Anxiety   . GERD (gastroesophageal reflux disease)   . Hyperlipidemia   . Personal history of colonic polyps      Past Surgical History:  Procedure Laterality Date  . CATARACT EXTRACTION W/ INTRAOCULAR LENS  IMPLANT, BILATERAL  12/15-R  1/16-- L  . CYST EXCISION  30 years ago   from back  . VASECTOMY      Social History   Socioeconomic History  . Marital status: Married    Spouse name: Not on file  . Number of children: 4  . Years of education: Not on file  . Highest education level: Not on file  Occupational History  . Occupation: Automotive engineer    Comment: [employer]formerly [employer]  Tobacco Use  . Smoking status: Former Smoker    Types: Cigarettes    Quit date: 12/07/2005    Years since quitting: 14.0  . Smokeless tobacco: Never Used  Substance and Sexual Activity  . Alcohol use: Yes    Alcohol/week: 3.0 standard drinks    Types: 3 Cans of beer per week  . Drug use: No  . Sexual activity: Yes  Other Topics Concern  . Not on file  Social History Narrative   Has living will   Now with health care  POA--- would want wife, then son Gwyndolyn Saxon   Would accept resuscitation   No tube feeds if cognitively unaware   Social Determinants of Health   Financial Resource Strain:   . Difficulty of Paying Living Expenses: Not on file  Food Insecurity:   . Worried About Charity fundraiser in the Last Year: Not on file  . Ran Out of Food in the Last Year: Not on file    Transportation Needs:   . Lack of Transportation (Medical): Not on file  . Lack of Transportation (Non-Medical): Not on file  Physical Activity:   . Days of Exercise per Week: Not on file  . Minutes of Exercise per Session: Not on file  Stress:   . Feeling of Stress : Not on file  Social Connections:   . Frequency of Communication with Friends and Family: Not on file  . Frequency of Social Gatherings with Friends and Family: Not on file  . Attends Religious Services: Not on file  . Active Member of Clubs or Organizations: Not on file  . Attends Archivist Meetings: Not on file  . Marital Status: Not on file  Intimate Partner Violence:   . Fear of Current or Ex-Partner: Not on file  . Emotionally Abused: Not on file  . Physically Abused: Not on file  . Sexually Abused: Not on file     Family History  Problem Relation Age of Onset  . Stroke Father   . Breast cancer Sister        breast cancer  . Arthritis Brother   . CAD Neg Hx   . Diabetes Neg Hx     No current outpatient medications on file.   Physical exam:  Vitals:   12/12/19 1512 12/12/19 1520  BP: (!) 176/76   Pulse: (!) 58   Resp: 16   Temp: 97.9 F (36.6 C)   TempSrc: Tympanic   SpO2:  100%  Weight: 196 lb 3.2 oz (89 kg)   Height: 5' 7.75" (1.721 m)    Physical Exam Constitutional:      General: He is not in acute distress. HENT:     Head: Normocephalic and atraumatic.  Eyes:     Pupils: Pupils are equal, round, and reactive to light.  Cardiovascular:     Rate and Rhythm: Normal rate and regular rhythm.     Heart sounds: Normal heart sounds.  Pulmonary:     Effort: Pulmonary effort is normal.     Breath sounds: Normal breath sounds.  Abdominal:     General: Bowel sounds are normal.     Palpations: Abdomen is soft.     Comments: No palpable splenomegaly  Musculoskeletal:     Cervical back: Normal range of motion.  Lymphadenopathy:     Comments: No palpable cervical, supraclavicular,  axillary or inguinal adenopathy   Skin:    General: Skin is warm and dry.  Neurological:     Mental Status: He is alert and oriented to person, place, and time.        CMP Latest Ref Rng & Units 11/27/2019  Glucose 70 - 99 mg/dL 97  BUN 6 - 23 mg/dL 20  Creatinine 0.40 - 1.50 mg/dL 0.97  Sodium 135 - 145 mEq/L 141  Potassium 3.5 - 5.1 mEq/L 4.9  Chloride 96 - 112 mEq/L 105  CO2 19 - 32 mEq/L 29  Calcium 8.4 - 10.5 mg/dL 9.6  Total Protein 6.0 - 8.3 g/dL 7.2  Total Bilirubin 0.2 -  1.2 mg/dL 0.8  Alkaline Phos 39 - 117 U/L 69  AST 0 - 37 U/L 27  ALT 0 - 53 U/L 19   CBC Latest Ref Rng & Units 12/12/2019  WBC 4.0 - 10.5 K/uL 6.7  Hemoglobin 13.0 - 17.0 g/dL 13.2  Hematocrit 39.0 - 52.0 % 41.0  Platelets 150 - 400 K/uL 600(H)     Assessment and plan- Patient is a 69 y.o. male referred for thrombocytosis  I have personally reviewed recent as well as previous labs.  Discussed with the patient that a platelet count of more than 400 is termed as thrombocytosis.  Thrombocytosis can be primary secondary to myeloproliferative neoplasm affecting the bone marrow versus secondary causes.  Today I will check a CBC with differential, JAK2, CAL R and MPL mutation testing as well as BCR ABL FISH testing.  I will check ESR and iron studies to rule out secondary causes of thrombocytosis as iron deficiency can lead to thrombocytosis although his hemoglobin is unremarkable.  I will see the patient back in 2 weeks time to discuss the results of the blood work for a video visit.  Discussed what myeloproliferative neoplasm is and the risk of thromboembolic events in patients greater than 33 years of age were diagnosed with essential thrombocytosis.   Thank you for this kind referral and the opportunity to participate in the care of this patient   Visit Diagnosis 1. Thrombocytosis ([place])     Dr. Randa Evens, MD, MPH [facility] [phone] 12/14/2019 2:57  PM

## 2019-12-18 LAB — JAK2 GENOTYPR

## 2019-12-18 LAB — MPL MUTATION ANALYSIS

## 2019-12-18 LAB — CALRETICULIN (CALR) MUTATION ANALYSIS

## 2019-12-19 LAB — BCR-ABL1 FISH
Cells Analyzed: 200
Cells Counted: 200

## 2019-12-26 ENCOUNTER — Encounter: Payer: Self-pay | Admitting: Oncology

## 2019-12-26 ENCOUNTER — Inpatient Hospital Stay (HOSPITAL_BASED_OUTPATIENT_CLINIC_OR_DEPARTMENT_OTHER): Payer: PPO | Admitting: Oncology

## 2019-12-26 DIAGNOSIS — D471 Chronic myeloproliferative disease: Secondary | ICD-10-CM

## 2019-12-26 DIAGNOSIS — Z1589 Genetic susceptibility to other disease: Secondary | ICD-10-CM

## 2019-12-26 DIAGNOSIS — D473 Essential (hemorrhagic) thrombocythemia: Secondary | ICD-10-CM

## 2019-12-26 DIAGNOSIS — D75839 Thrombocytosis, unspecified: Secondary | ICD-10-CM

## 2019-12-26 NOTE — Progress Notes (Signed)
Patient stated that he had been doing well with no complaints. However, patient stated that he had been having trouble sleeping for years.

## 2019-12-27 ENCOUNTER — Telehealth: Payer: Self-pay

## 2019-12-27 NOTE — Telephone Encounter (Signed)
Called patient to let him know when his Bone Marrow Biopsy was scheduled for and the time of arrival. Patient agreed and had no questions at this time. 01/03/2020 at the [place] time: 07:30 AM Follow up/Video visit w/ Dr. Janese Banks: 01/09/2020

## 2019-12-28 NOTE — Progress Notes (Signed)
I connected with Leroy Morton on 12/28/19 at  2:45 PM EST by video enabled telemedicine visit and verified that I am speaking with the correct person using two identifiers.   I discussed the limitations, risks, security and privacy concerns of performing an evaluation and management service by telemedicine and the availability of in-person appointments. I also discussed with the patient that there may be a patient responsible charge related to this service. The patient expressed understanding and agreed to proceed.  Other persons participating in the visit and their role in the encounter:  Patients wife  Patient's location:  home Provider's location:  work  Risk analyst Complaint: Discuss results of blood work  Diagnosis: JAK2 positive thrombocytosis  History of present illness: patient is a 69 year old gentleman with a past medical history significant for hypertension and GERD referred to [place] for thrombocytosis. Most recent CBC from 11/27/2019 showed platelet count of 651.  His prior platelet count from 05/30/2018 was 41.  White cell count and hemoglobin have remained normal.  Patient denies any changes in his appetite or unintentional weight loss or night sweats.  Denies any lumps or bumps anywhere.  Denies any prior thromboembolic episodes.   Results of blood work from 12/12/2019 were as follows, CBC showed white count of 6.7, H&H of 13.2/41 with a platelet count of 600.  JAK2 mutation testing was positive.  CAL R and MPL mutation testing negative.  BCR abl fish negative.  ESR normal.  Iron studies normal.  Interval history overall patient feels well and denies any symptoms at this time   Review of Systems  Constitutional: Negative for chills, fever, malaise/fatigue and weight loss.  HENT: Negative for congestion, ear discharge and nosebleeds.   Eyes: Negative for blurred vision.  Respiratory: Negative for cough, hemoptysis, sputum production, shortness of breath and wheezing.   Cardiovascular:  Negative for chest pain, palpitations, orthopnea and claudication.  Gastrointestinal: Negative for abdominal pain, blood in stool, constipation, diarrhea, heartburn, melena, nausea and vomiting.  Genitourinary: Negative for dysuria, flank pain, frequency, hematuria and urgency.  Musculoskeletal: Negative for back pain, joint pain and myalgias.  Skin: Negative for rash.  Neurological: Negative for dizziness, tingling, focal weakness, seizures, weakness and headaches.  Endo/Heme/Allergies: Does not bruise/bleed easily.  Psychiatric/Behavioral: Negative for depression and suicidal ideas. The patient does not have insomnia.     Allergies  Allergen Reactions  . Other Other (See Comments)    " Fresh " seafood... Nausea, dyspnea    Past Medical History:  Diagnosis Date  . Anxiety   . GERD (gastroesophageal reflux disease)   . Hyperlipidemia   . Personal history of colonic polyps     Past Surgical History:  Procedure Laterality Date  . CATARACT EXTRACTION W/ INTRAOCULAR LENS  IMPLANT, BILATERAL  12/15-R  1/16-- L  . CYST EXCISION  30 years ago   from back  . VASECTOMY      Social History   Socioeconomic History  . Marital status: Married    Spouse name: Not on file  . Number of children: 4  . Years of education: Not on file  . Highest education level: Not on file  Occupational History  . Occupation: Automotive engineer    Comment: [employer]  Tobacco Use  . Smoking status: Former Smoker    Types: Cigarettes    Quit date: 12/07/2005    Years since quitting: 14.0  . Smokeless tobacco: Never Used  Substance and Sexual Activity  . Alcohol use: Yes  Alcohol/week: 3.0 standard drinks    Types: 3 Cans of beer per week  . Drug use: No  . Sexual activity: Yes  Other Topics Concern  . Not on file  Social History Narrative   Has living will   Now with health care POA--- would want wife, then son Gwyndolyn Saxon   Would accept resuscitation   No tube feeds  if cognitively unaware   Social Determinants of Health   Financial Resource Strain:   . Difficulty of Paying Living Expenses: Not on file  Food Insecurity:   . Worried About Charity fundraiser in the Last Year: Not on file  . Ran Out of Food in the Last Year: Not on file  Transportation Needs:   . Lack of Transportation (Medical): Not on file  . Lack of Transportation (Non-Medical): Not on file  Physical Activity:   . Days of Exercise per Week: Not on file  . Minutes of Exercise per Session: Not on file  Stress:   . Feeling of Stress : Not on file  Social Connections:   . Frequency of Communication with Friends and Family: Not on file  . Frequency of Social Gatherings with Friends and Family: Not on file  . Attends Religious Services: Not on file  . Active Member of Clubs or Organizations: Not on file  . Attends Archivist Meetings: Not on file  . Marital Status: Not on file  Intimate Partner Violence:   . Fear of Current or Ex-Partner: Not on file  . Emotionally Abused: Not on file  . Physically Abused: Not on file  . Sexually Abused: Not on file    Family History  Problem Relation Age of Onset  . Stroke Father   . Breast cancer Sister        breast cancer  . Arthritis Brother   . CAD Neg Hx   . Diabetes Neg Hx     No current outpatient medications on file.  No results found.  No images are attached to the encounter.   CMP Latest Ref Rng & Units 11/27/2019  Glucose 70 - 99 mg/dL 97  BUN 6 - 23 mg/dL 20  Creatinine 0.40 - 1.50 mg/dL 0.97  Sodium 135 - 145 mEq/L 141  Potassium 3.5 - 5.1 mEq/L 4.9  Chloride 96 - 112 mEq/L 105  CO2 19 - 32 mEq/L 29  Calcium 8.4 - 10.5 mg/dL 9.6  Total Protein 6.0 - 8.3 g/dL 7.2  Total Bilirubin 0.2 - 1.2 mg/dL 0.8  Alkaline Phos 39 - 117 U/L 69  AST 0 - 37 U/L 27  ALT 0 - 53 U/L 19   CBC Latest Ref Rng & Units 12/12/2019  WBC 4.0 - 10.5 K/uL 6.7  Hemoglobin 13.0 - 17.0 g/dL 13.2  Hematocrit 39.0 - 52.0 % 41.0   Platelets 150 - 400 K/uL 600(H)     Observation/objective: Appears in no acute distress on video visit today.  Breathing is nonlabored  Assessment and plan: Patient is a 69 year old male with JAK2 positive thrombocytosis is here to discuss results of blood work  Explained to the patient that he has had a consistently elevated platelet count over the last 1 year.  The fact that he is JAK2 positive is consistent with a myeloproliferative disorder involving the bone marrow.  Patient does not have polycythemia and BCR ABL testing was negative.  He therefore does not have polycythemia vera or CML.  Patient needs a bone marrow biopsy to distinguish between essential  thrombocytosis and primary myelofibrosis.  Discussed risks and benefits of bone marrow biopsy including all but not limited to possible risk of pain and bleeding.  He will be undergoing bone marrow biopsy with radiology and we will schedule that.  If this is essential thrombocytosis patient will need cytoreductive agents as he falls under high risk given that he is greater than 70 years of age.  He has not had any prior thrombosis.  I would recommend starting Hydrea 500 mg/day and titrating it to keep his platelet count less than 400 after the bone marrow biopsy results are back.  In the meanwhile he should start taking aspirin 81 mg daily.  Discussed risk of thromboembolic phenomena with myeloproliferative neoplasms.  Patient verbalized understanding.  Patient and his wife had multiple insightful questions and they were answered to his satisfaction.  It would be okay for him to take Covid vaccine when he is able to get an appointment  Follow-up instructions: I will see him back in 2 weeks time to discuss the results of a bone marrow biopsy through a video visit  I discussed the assessment and treatment plan with the patient. The patient was provided an opportunity to ask questions and all were answered. The patient agreed with the plan and  demonstrated an understanding of the instructions.   The patient was advised to call back or seek an in-person evaluation if the symptoms worsen or if the condition fails to improve as anticipated.   Visit Diagnosis: 1. MPN (myeloproliferative neoplasm) ([place])   2. Thrombocytosis ([place])   3. JAK2 V617F mutation     Dr. Randa Evens, MD, MPH [place] at [facility] Tel- [phone] 12/28/2019 1:38 PM

## 2020-01-02 ENCOUNTER — Other Ambulatory Visit: Payer: Self-pay | Admitting: Radiology

## 2020-01-03 ENCOUNTER — Ambulatory Visit
Admission: RE | Admit: 2020-01-03 | Discharge: 2020-01-03 | Disposition: A | Discharge status: Home / Self Care | Payer: PPO | Source: Ambulatory Visit | Attending: Oncology | Admitting: Oncology

## 2020-01-03 ENCOUNTER — Other Ambulatory Visit: Payer: Self-pay

## 2020-01-03 DIAGNOSIS — Z9841 Cataract extraction status, right eye: Secondary | ICD-10-CM | POA: Diagnosis not present

## 2020-01-03 DIAGNOSIS — Z803 Family history of malignant neoplasm of breast: Secondary | ICD-10-CM | POA: Diagnosis not present

## 2020-01-03 DIAGNOSIS — Z87891 Personal history of nicotine dependence: Secondary | ICD-10-CM | POA: Insufficient documentation

## 2020-01-03 DIAGNOSIS — D473 Essential (hemorrhagic) thrombocythemia: Secondary | ICD-10-CM | POA: Diagnosis not present

## 2020-01-03 DIAGNOSIS — Z9842 Cataract extraction status, left eye: Secondary | ICD-10-CM | POA: Insufficient documentation

## 2020-01-03 DIAGNOSIS — D471 Chronic myeloproliferative disease: Secondary | ICD-10-CM | POA: Insufficient documentation

## 2020-01-03 LAB — CBC WITH DIFFERENTIAL/PLATELET
Abs Immature Granulocytes: 0.02 10*3/uL (ref 0.00–0.07)
Basophils Absolute: 0 10*3/uL (ref 0.0–0.1)
Basophils Relative: 1 %
Eosinophils Absolute: 0.1 10*3/uL (ref 0.0–0.5)
Eosinophils Relative: 2 %
HCT: 44.6 % (ref 39.0–52.0)
Hemoglobin: 14.1 g/dL (ref 13.0–17.0)
Immature Granulocytes: 0 %
Lymphocytes Relative: 29 %
Lymphs Abs: 1.8 10*3/uL (ref 0.7–4.0)
MCH: 30.3 pg (ref 26.0–34.0)
MCHC: 31.6 g/dL (ref 30.0–36.0)
MCV: 95.7 fL (ref 80.0–100.0)
Monocytes Absolute: 0.5 10*3/uL (ref 0.1–1.0)
Monocytes Relative: 8 %
Neutro Abs: 3.8 10*3/uL (ref 1.7–7.7)
Neutrophils Relative %: 60 %
Platelets: 689 10*3/uL — ABNORMAL HIGH (ref 150–400)
RBC: 4.66 MIL/uL (ref 4.22–5.81)
RDW: 13.7 % (ref 11.5–15.5)
WBC: 6.3 10*3/uL (ref 4.0–10.5)
nRBC: 0 % (ref 0.0–0.2)

## 2020-01-03 MED ORDER — FENTANYL CITRATE (PF) 100 MCG/2ML IJ SOLN
INTRAMUSCULAR | Status: AC | PRN
  Administered 2020-01-03 (×2): 50 ug via INTRAVENOUS

## 2020-01-03 MED ORDER — FENTANYL CITRATE (PF) 100 MCG/2ML IJ SOLN
INTRAMUSCULAR | Status: AC
  Filled 2020-01-03: qty 2

## 2020-01-03 MED ORDER — MIDAZOLAM HCL 2 MG/2ML IJ SOLN
INTRAMUSCULAR | Status: AC | PRN
  Administered 2020-01-03: 1 mg via INTRAVENOUS

## 2020-01-03 MED ORDER — MIDAZOLAM HCL 5 MG/5ML IJ SOLN
INTRAMUSCULAR | Status: AC
  Filled 2020-01-03: qty 5

## 2020-01-03 MED ORDER — HEPARIN SOD (PORK) LOCK FLUSH 100 UNIT/ML IV SOLN
INTRAVENOUS | Status: AC
  Filled 2020-01-03: qty 5

## 2020-01-03 MED ORDER — SODIUM CHLORIDE 0.9 % IV SOLN: INTRAVENOUS | Status: DC

## 2020-01-03 MED ORDER — MIDAZOLAM HCL 5 MG/5ML IJ SOLN
INTRAMUSCULAR | Status: AC | PRN
  Administered 2020-01-03: 1 mg via INTRAVENOUS

## 2020-01-03 NOTE — Discharge Instructions (Signed)
Bone Marrow Aspiration and Bone Marrow Biopsy, Adult, Care After This sheet gives you information about how to care for yourself after your procedure. Your health care provider may also give you more specific instructions. If you have problems or questions, contact your health care provider. What can I expect after the procedure? After the procedure, it is common to have:  Mild pain and tenderness.  Swelling.  Bruising. Follow these instructions at home: Puncture site care   Follow instructions from your health care provider about how to take care of the puncture site. Make sure you: ? Wash your hands with soap and water before and after you change your bandage (dressing). If soap and water are not available, use hand sanitizer. ? Change your dressing as told by your health care provider.  Check your puncture site every day for signs of infection. Check for: ? More redness, swelling, or pain. ? Fluid or blood. ? Warmth. ? Pus or a bad smell. Activity  Return to your normal activities as told by your health care provider. Ask your health care provider what activities are safe for you.  Do not lift anything that is heavier than 10 lb (4.5 kg), or the limit that you are told, until your health care provider says that it is safe.  Do not drive for 24 hours if you were given a sedative during your procedure. General instructions   Take over-the-counter and prescription medicines only as told by your health care provider.  Do not take baths, swim, or use a hot tub until your health care provider approves. Ask your health care provider if you may take showers. You may only be allowed to take sponge baths.  If directed, put ice on the affected area. To do this: ? Put ice in a plastic bag. ? Place a towel between your skin and the bag. ? Leave the ice on for 20 minutes, 2-3 times a day.  Keep all follow-up visits as told by your health care provider. This is important. Contact a  health care provider if:  Your pain is not controlled with medicine.  You have a fever.  You have more redness, swelling, or pain around the puncture site.  You have fluid or blood coming from the puncture site.  Your puncture site feels warm to the touch.  You have pus or a bad smell coming from the puncture site. Summary  After the procedure, it is common to have mild pain, tenderness, swelling, and bruising.  Follow instructions from your health care provider about how to take care of the puncture site and what activities are safe for you.  Take over-the-counter and prescription medicines only as told by your health care provider.  Contact a health care provider if you have any signs of infection, such as fluid or blood coming from the puncture site. This information is not intended to replace advice given to you by your health care provider. Make sure you discuss any questions you have with your health care provider. Document Revised: 04/11/2019 Document Reviewed: 04/11/2019 Elsevier Patient Education  2020 [employer]  

## 2020-01-03 NOTE — Procedures (Signed)
Interventional Radiology Procedure:   Indications: Thrombocytosis  Procedure: CT guided bone marrow biopsy  Findings: 2 aspirates and 2 cores from right ilium  Complications: None     EBL: Minimal, less than 10 ml  Plan: Discharge to home in one hour.   Rice Walsh R. Maurie Musco, MD  Pager: [phone]   

## 2020-01-03 NOTE — Progress Notes (Signed)
Patient clinically stable post BMB per Dr Anselm Pancoast , tolerated well. Vitals stable throughout, awake/alert and oriented post procedure. Received Versed 2mg  along with Fentanyl 100 mcg IV for procedure. Report given to [place] with questions answered.

## 2020-01-03 NOTE — Consult Note (Signed)
Chief Complaint: Patient was seen in consultation today for CT-guided biopsy.  Referring Physician(s): Rao,Archana C  Patient Status: [facility] - Out-pt  History of Present Illness: Leroy PINNEY Sr. is a 69 y.o. male JAK2 positive thrombocytosis.  Request for CT-guided bone marrow biopsy.  Patient had COVID-19 around Thanksgiving.  Patient recovered from the infection without any serious consequences.  Patient has no complaints today.  Past Medical History:  Diagnosis Date  . Anxiety   . GERD (gastroesophageal reflux disease)   . Hyperlipidemia   . Personal history of colonic polyps     Past Surgical History:  Procedure Laterality Date  . CATARACT EXTRACTION W/ INTRAOCULAR LENS  IMPLANT, BILATERAL  12/15-R  1/16-- L  . CYST EXCISION  30 years ago   from back  . VASECTOMY      Allergies: Other  Medications: Prior to Admission medications   Not on File     Family History  Problem Relation Age of Onset  . Stroke Father   . Breast cancer Sister        breast cancer  . Arthritis Brother   . CAD Neg Hx   . Diabetes Neg Hx     Social History   Socioeconomic History  . Marital status: Married    Spouse name: Not on file  . Number of children: 4  . Years of education: Not on file  . Highest education level: Not on file  Occupational History  . Occupation: Automotive engineer    Comment: [employer]  Tobacco Use  . Smoking status: Former Smoker    Types: Cigarettes    Quit date: 12/07/2005    Years since quitting: 14.0  . Smokeless tobacco: Never Used  Substance and Sexual Activity  . Alcohol use: Yes    Alcohol/week: 3.0 standard drinks    Types: 3 Cans of beer per week  . Drug use: No  . Sexual activity: Yes  Other Topics Concern  . Not on file  Social History Narrative   Has living will   Now with health care POA--- would want wife, then son Gwyndolyn Saxon   Would accept resuscitation   No tube feeds if cognitively unaware    Social Determinants of Health   Financial Resource Strain:   . Difficulty of Paying Living Expenses: Not on file  Food Insecurity:   . Worried About Charity fundraiser in the Last Year: Not on file  . Ran Out of Food in the Last Year: Not on file  Transportation Needs:   . Lack of Transportation (Medical): Not on file  . Lack of Transportation (Non-Medical): Not on file  Physical Activity:   . Days of Exercise per Week: Not on file  . Minutes of Exercise per Session: Not on file  Stress:   . Feeling of Stress : Not on file  Social Connections:   . Frequency of Communication with Friends and Family: Not on file  . Frequency of Social Gatherings with Friends and Family: Not on file  . Attends Religious Services: Not on file  . Active Member of Clubs or Organizations: Not on file  . Attends Archivist Meetings: Not on file  . Marital Status: Not on file    Review of Systems: A 12 point ROS discussed and pertinent positives are indicated in the HPI above.  All other systems are negative.  Review of Systems  Constitutional: Negative for chills and fever.  Respiratory: Negative.  Cardiovascular: Negative.   Gastrointestinal: Negative.   Genitourinary: Negative.     Vital Signs: BP (!) 167/73   Pulse (!) 51   Temp 97.9 F (36.6 C) (Oral)   Resp 16   Ht 5' 8"  (1.727 m)   Wt 87.1 kg   SpO2 98%   BMI 29.19 kg/m   Physical Exam Constitutional:      Appearance: Normal appearance. He is not ill-appearing.  Cardiovascular:     Rate and Rhythm: Normal rate and regular rhythm.  Pulmonary:     Effort: Pulmonary effort is normal.     Breath sounds: Normal breath sounds.  Abdominal:     General: Abdomen is flat.     Palpations: Abdomen is soft.  Skin:    Comments: Focal bump in the skin at the midline of the mid thoracic spine.  This area is overlying the spinous processes.  Soft tissue in this area is slightly full.  Physical exam findings raise the possibility  of a lipoma.  This area is nontender to the patient.  Patient was unaware of this bump.  Neurological:     Mental Status: He is alert.     Imaging: No results found.  Labs:  CBC: Recent Labs    11/27/19 1148 12/12/19 1557 01/03/20 0808  WBC 8.8 6.7 6.3  HGB 14.3 13.2 14.1  HCT 43.6 41.0 44.6  PLT 651.0* 600* 689*    COAGS: No results for input(s): INR, APTT in the last 8760 hours.  BMP: Recent Labs    11/27/19 1148  NA 141  K 4.9  CL 105  CO2 29  GLUCOSE 97  BUN 20  CALCIUM 9.6  CREATININE 0.97    LIVER FUNCTION TESTS: Recent Labs    11/27/19 1148  BILITOT 0.8  AST 27  ALT 19  ALKPHOS 69  PROT 7.2  ALBUMIN 4.5    TUMOR MARKERS: No results for input(s): AFPTM, CEA, CA199, CHROMGRNA in the last 8760 hours.  Assessment and Plan:  69 year old with JAK2 positive thrombocytosis.  Request for CT-guided bone marrow biopsy.  Risks and benefits of CT-guided bone marrow biopsy was discussed with the patient and/or patient's family including, but not limited to bleeding, infection, damage to adjacent structures or low yield requiring additional tests.  All of the questions were answered and there is agreement to proceed.  Consent signed and in chart.    Thank you for this interesting consult.  I greatly enjoyed meeting Leroy NANCE Sr. and look forward to participating in their care.  A copy of this report was sent to the requesting provider on this date.  Electronically Signed: Burman Riis, MD 01/03/2020, 8:47 AM   I spent a total of  15 Minutes   in face to face in clinical consultation, greater than 50% of which was counseling/coordinating care for CT-guided bone marrow biopsy.

## 2020-01-09 ENCOUNTER — Telehealth: Payer: PPO | Admitting: Oncology

## 2020-01-11 ENCOUNTER — Encounter: Payer: Self-pay | Admitting: Oncology

## 2020-01-11 ENCOUNTER — Other Ambulatory Visit: Payer: Self-pay

## 2020-01-11 ENCOUNTER — Encounter (HOSPITAL_COMMUNITY): Payer: Self-pay | Admitting: Oncology

## 2020-01-11 ENCOUNTER — Inpatient Hospital Stay: Payer: PPO | Attending: Oncology | Admitting: Oncology

## 2020-01-11 DIAGNOSIS — Z7189 Other specified counseling: Secondary | ICD-10-CM | POA: Diagnosis not present

## 2020-01-11 DIAGNOSIS — D473 Essential (hemorrhagic) thrombocythemia: Secondary | ICD-10-CM | POA: Diagnosis not present

## 2020-01-11 MED ORDER — HYDROXYUREA 500 MG PO CAPS: 500.0000 mg | ORAL_CAPSULE | Freq: Two times a day (BID) | ORAL | 3 refills | Status: DC

## 2020-01-11 NOTE — Progress Notes (Signed)
Patient had been doing well and would like his biopsy results.

## 2020-01-12 LAB — SURGICAL PATHOLOGY

## 2020-01-15 DIAGNOSIS — D473 Essential (hemorrhagic) thrombocythemia: Secondary | ICD-10-CM | POA: Insufficient documentation

## 2020-01-15 DIAGNOSIS — Z7189 Other specified counseling: Secondary | ICD-10-CM | POA: Insufficient documentation

## 2020-01-15 NOTE — Progress Notes (Signed)
I connected with Leroy Morton on 01/15/20 at  2:15 PM EST by video enabled telemedicine visit and verified that I am speaking with the correct person using two identifiers.   I discussed the limitations, risks, security and privacy concerns of performing an evaluation and management service by telemedicine and the availability of in-person appointments. I also discussed with the patient that there may be a patient responsible charge related to this service. The patient expressed understanding and agreed to proceed.  Other persons participating in the visit and their role in the encounter:  Patients wife  Patient's location:  home Provider's location:  workk  Risk analyst Complaint: Discuss results of bone marrow biopsy and further management  Diagnosis: Essential thrombocytosis  History of present illness: patient is a 69 year old gentleman with a past medical history significant for hypertension and GERD referred to [place] for thrombocytosis.Most recent CBC from 11/27/2019 showed platelet count of 651. His prior platelet count from 05/30/2018 was 41. White cell count and hemoglobin have remained normal. Patient denies any changes in his appetite or unintentional weight loss or night sweats. Denies any lumps or bumps anywhere. Denies any prior thromboembolic episodes.   Results of blood work from 12/12/2019 were as follows, CBC showed white count of 6.7, H&H of 13.2/41 with a platelet count of 600.  JAK2 mutation testing was positive.  CAL R and MPL mutation testing negative.  BCR abl fish negative.  ESR normal.  Iron studies normal.  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  Findings are consistent with myeloproliferative neoplasm most likely essential thrombocythemia   Interval history patient feels well and denies any complaints at this time.  He is tolerating low-dose aspirin well without any significant side effects.  He is active and works a  full-time job   Review of Systems  Constitutional: Negative for chills, fever, malaise/fatigue and weight loss.  HENT: Negative for congestion, ear discharge and nosebleeds.   Eyes: Negative for blurred vision.  Respiratory: Negative for cough, hemoptysis, sputum production, shortness of breath and wheezing.   Cardiovascular: Negative for chest pain, palpitations, orthopnea and claudication.  Gastrointestinal: Negative for abdominal pain, blood in stool, constipation, diarrhea, heartburn, melena, nausea and vomiting.  Genitourinary: Negative for dysuria, flank pain, frequency, hematuria and urgency.  Musculoskeletal: Negative for back pain, joint pain and myalgias.  Skin: Negative for rash.  Neurological: Negative for dizziness, tingling, focal weakness, seizures, weakness and headaches.  Endo/Heme/Allergies: Does not bruise/bleed easily.  Psychiatric/Behavioral: Negative for depression and suicidal ideas. The patient does not have insomnia.     Allergies  Allergen Reactions  . Other Other (See Comments)    " Fresh " seafood... Nausea, dyspnea    Past Medical History:  Diagnosis Date  . Anxiety   . GERD (gastroesophageal reflux disease)   . Hyperlipidemia   . Personal history of colonic polyps     Past Surgical History:  Procedure Laterality Date  . CATARACT EXTRACTION W/ INTRAOCULAR LENS  IMPLANT, BILATERAL  12/15-R  1/16-- L  . CYST EXCISION  30 years ago   from back  . VASECTOMY      Social History   Socioeconomic History  . Marital status: Married    Spouse name: Not on file  . Number of children: 4  . Years of education: Not on file  . Highest education level: Not on file  Occupational History  . Occupation: Automotive engineer    Comment: [employer]  Tobacco Use  .  Smoking status: Former Smoker    Types: Cigarettes    Quit date: 12/07/2005    Years since quitting: 14.1  . Smokeless tobacco: Never Used  Substance and Sexual  Activity  . Alcohol use: Yes    Alcohol/week: 3.0 standard drinks    Types: 3 Cans of beer per week  . Drug use: No  . Sexual activity: Yes  Other Topics Concern  . Not on file  Social History Narrative   Has living will   Now with health care POA--- would want wife, then son Gwyndolyn Saxon   Would accept resuscitation   No tube feeds if cognitively unaware   Social Determinants of Health   Financial Resource Strain:   . Difficulty of Paying Living Expenses: Not on file  Food Insecurity:   . Worried About Charity fundraiser in the Last Year: Not on file  . Ran Out of Food in the Last Year: Not on file  Transportation Needs:   . Lack of Transportation (Medical): Not on file  . Lack of Transportation (Non-Medical): Not on file  Physical Activity:   . Days of Exercise per Week: Not on file  . Minutes of Exercise per Session: Not on file  Stress:   . Feeling of Stress : Not on file  Social Connections:   . Frequency of Communication with Friends and Family: Not on file  . Frequency of Social Gatherings with Friends and Family: Not on file  . Attends Religious Services: Not on file  . Active Member of Clubs or Organizations: Not on file  . Attends Archivist Meetings: Not on file  . Marital Status: Not on file  Intimate Partner Violence:   . Fear of Current or Ex-Partner: Not on file  . Emotionally Abused: Not on file  . Physically Abused: Not on file  . Sexually Abused: Not on file    Family History  Problem Relation Age of Onset  . Stroke Father   . Breast cancer Sister        breast cancer  . Arthritis Brother   . CAD Neg Hx   . Diabetes Neg Hx      Current Outpatient Medications:  .  hydroxyurea (HYDREA) 500 MG capsule, Take 1 capsule (500 mg total) by mouth 2 (two) times daily. May take with food to minimize GI side effects., Disp: 60 capsule, Rfl: 3  CT BONE MARROW BIOPSY & ASPIRATION  Result Date: 01/03/2020 INDICATION: 69 year old with JAK2 positive  thrombocytosis. Request for bone marrow biopsy. Patient also has a palpable lesion in the mid back discovered during the pre procedure examination. EXAM: CT GUIDED BONE MARROW ASPIRATES AND BIOPSY Physician: Stephan Minister. Anselm Pancoast, MD MEDICATIONS: None. ANESTHESIA/SEDATION: Fentanyl 100 mcg IV; Versed 2.0 mg IV Moderate Sedation Time:  14 minutes The patient was continuously monitored during the procedure by the interventional radiology nurse under my direct supervision. COMPLICATIONS: None immediate. PROCEDURE: The procedure was explained to the patient. The risks and benefits of the procedure were discussed and the patient's questions were addressed. Informed consent was obtained from the patient. The patient was placed prone on CT table. The area of concern in the mid back was marked and small set of images were obtained through this area to evaluate. Subsequently, images of the pelvis were obtained. The right side of back was prepped and draped in sterile fashion. The skin and right posterior ilium were anesthetized with 1% lidocaine. 11 gauge bone needle was directed into the right ilium  with CT guidance. Two aspirates and two core biopsies were obtained. Bandage placed over the puncture site. FINDINGS: At the area of concern in the midthoracic spine, there is a superficial fat attenuating lesion located just superficial to the musculatures and overlying the spinous process. Structure measures 4.1 x 1.0 cm. 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 Bone needle was directed into the posterior right ilium. Two aspirates and two core biopsies were obtained. IMPRESSION: 1. Successful CT-guided bone marrow biopsy. Specimens obtained from the right iliac bone. 2. The palpable area of concern in the midthoracic spine is compatible with a fatty lesion. 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 Electronically Signed   By: Markus Daft M.D.   On: 01/03/2020 11:40    No images are attached to  the encounter.   CMP Latest Ref Rng & Units 11/27/2019  Glucose 70 - 99 mg/dL 97  BUN 6 - 23 mg/dL 20  Creatinine 0.40 - 1.50 mg/dL 0.97  Sodium 135 - 145 mEq/L 141  Potassium 3.5 - 5.1 mEq/L 4.9  Chloride 96 - 112 mEq/L 105  CO2 19 - 32 mEq/L 29  Calcium 8.4 - 10.5 mg/dL 9.6  Total Protein 6.0 - 8.3 g/dL 7.2  Total Bilirubin 0.2 - 1.2 mg/dL 0.8  Alkaline Phos 39 - 117 U/L 69  AST 0 - 37 U/L 27  ALT 0 - 53 U/L 19   CBC Latest Ref Rng & Units 01/03/2020  WBC 4.0 - 10.5 K/uL 6.3  Hemoglobin 13.0 - 17.0 g/dL 14.1  Hematocrit 39.0 - 52.0 % 44.6  Platelets 150 - 400 K/uL 689(H)     Observation/objective: Appears in no acute distress on video visit today.  Breathing is nonlabored  Assessment and plan: Patient is a 69 year old male with JAK2 positive thrombocytosis consistent with essential thrombocythemia on a bone marrow biopsy  I discussed the results of the bone marrow biopsy with the patient which shows evidence of megakaryocytic hyperplasia.  Based on his labs he does not have P vera or CML.  No evidence of reticulin fibrosis noticed on the bone marrow.  Findings are more consistent with essential thrombocytosis.  Given that he is JAK2 positive and more than 69 years of age he falls in the high risk category and I would recommend cytoreductive therapy in addition to low-dose aspirin.  Based on his weight is required dose of Hydrea is 1300 mg.  I will start off with 500 mg twice daily to maintain a target platelet count of less than 400.  Dose will need to be adjusted to avoid any other cytopenias.  Discussed risks and benefits of Hydrea including all but not limited to mouth ulcers, nail hyperpigmentation, skin ulcers, dizziness.  Patient understands and agrees to proceed as planned.  Discussed the natural history of essential thrombocytosis and possible risk of progression to fibrotic phase and rare complications such as acute leukemia.  Overall prognosis for high risk ET at his age  translates to a median survival of about 13 years.  Patient verbalized understanding.  Patient and his wife had multiple insightful questions all of them were answered to their satisfaction I will send a prescription for Hydrea to his pharmacy and he will start taking it daily.  Treatment is being given with a palliative intent  Follow-up instructions: CBC with differential, CMP followed by video visit 1 month  I discussed the assessment and treatment plan with the patient. The patient was provided an opportunity to ask questions  and all were answered. The patient agreed with the plan and demonstrated an understanding of the instructions.   The patient was advised to call back or seek an in-person evaluation if the symptoms worsen or if the condition fails to improve as anticipated.    Visit Diagnosis: 1. Essential thrombocytosis ([place])     Dr. Randa Evens, MD, MPH [facility] at [facility] Tel- [phone] 01/15/2020 8:15 AM

## 2020-02-05 ENCOUNTER — Encounter: Payer: Self-pay | Admitting: Oncology

## 2020-02-06 ENCOUNTER — Telehealth: Payer: Self-pay

## 2020-02-06 NOTE — Telephone Encounter (Signed)
Please see pt message with Dr Janese Banks on 02/05/20.

## 2020-02-06 NOTE — Telephone Encounter (Signed)
[facility] Night - Client TELEPHONE ADVICE RECORD [facility] Patient Name: Leroy Morton Gender: Male DOB: 1951/09/28 Age: 69 Y 85 M 22 D Return Phone Number: [phone] (Primary), [phone] (Secondary) [place] [address] Client [facility] Night - Client Client Site [facility] Physician Viviana Simpler - MD Contact Type Call Who Is Calling Patient / Member / Family / Caregiver Call Type Triage / Clinical Caller Name Makana Poppen Relationship To Patient Spouse Return Phone Number [phone] (Primary) Chief Complaint Medication Question (non symptomatic) Reason for Call Symptomatic / Request for Health Information Initial Comment Caller's husband is supposed to get the vaccine tomorrow. He is on the hydroxyurea and she is concerned if he can still get the vaccine. Translation No Nurse Assessment Nurse: Fabio Neighbors, RN, Kitty Date/Time (Eastern Time): 02/05/2020 6:22:43 PM Confirm and document reason for call. If symptomatic, describe symptoms. ---Caller is on their way to get the first coronavirus vaccination tonight. They are concerned because her spouse take hydroxyurea. Per [facility]), you will need to consult with your doctor prior to taking any vaccines. Per spouse, she stated that the doctor ok'd the vaccination. Instructed them to follow their doctor's instructions. Verbalized understanding. Has the patient had close contact with a person known or suspected to have the novel coronavirus illness OR traveled / lives in area with major community spread (including international travel) in the last 14 days from the onset of symptoms? * If Asymptomatic, screen for exposure and travel within the last 14 days. ---No Does the patient have any new or worsening symptoms? ---No Guidelines Guideline Title Affirmed Question Affirmed Notes Nurse Date/Time (Eastern Time) Disp.  Time Eilene Ghazi Time) Disposition Final User 02/05/2020 6:05:34 PM Send To RN Personal Vertell Limber, RN, Quillian Quince 02/05/2020 6:26:14 PM Clinical Call Yes Fabio Neighbors, RN, Perrin Smack

## 2020-02-07 NOTE — Telephone Encounter (Signed)
Please make sure he got the message to go ahead with the vaccine

## 2020-02-07 NOTE — Telephone Encounter (Signed)
Spoke to pt. He went ahead with the vaccine 02-05-20. Said he felt a little bad yesterday but feeling better today.

## 2020-02-08 ENCOUNTER — Encounter: Payer: Self-pay | Admitting: Oncology

## 2020-02-08 ENCOUNTER — Inpatient Hospital Stay: Payer: PPO | Attending: Oncology

## 2020-02-08 ENCOUNTER — Other Ambulatory Visit: Payer: Self-pay

## 2020-02-08 ENCOUNTER — Other Ambulatory Visit: Payer: Self-pay | Admitting: *Deleted

## 2020-02-08 DIAGNOSIS — D473 Essential (hemorrhagic) thrombocythemia: Secondary | ICD-10-CM

## 2020-02-08 LAB — COMPREHENSIVE METABOLIC PANEL
ALT: 19 U/L (ref 0–44)
AST: 22 U/L (ref 15–41)
Albumin: 4.4 g/dL (ref 3.5–5.0)
Alkaline Phosphatase: 66 U/L (ref 38–126)
Anion gap: 4 — ABNORMAL LOW (ref 5–15)
BUN: 17 mg/dL (ref 8–23)
CO2: 31 mmol/L (ref 22–32)
Calcium: 9.1 mg/dL (ref 8.9–10.3)
Chloride: 103 mmol/L (ref 98–111)
Creatinine, Ser: 0.92 mg/dL (ref 0.61–1.24)
GFR calc Af Amer: >60 mL/min (ref >60)
GFR calc non Af Amer: >60 mL/min (ref >60)
Glucose, Bld: 104 mg/dL — ABNORMAL HIGH (ref 70–99)
Potassium: 3.9 mmol/L (ref 3.5–5.1)
Sodium: 138 mmol/L (ref 135–145)
Total Bilirubin: 0.9 mg/dL (ref 0.3–1.2)
Total Protein: 7.5 g/dL (ref 6.5–8.1)

## 2020-02-08 LAB — CBC WITH DIFFERENTIAL/PLATELET
Abs Immature Granulocytes: 0.01 10*3/uL (ref 0.00–0.07)
Basophils Absolute: 0.1 10*3/uL (ref 0.0–0.1)
Basophils Relative: 1 %
Eosinophils Absolute: 0.1 10*3/uL (ref 0.0–0.5)
Eosinophils Relative: 2 %
HCT: 43.9 % (ref 39.0–52.0)
Hemoglobin: 14.5 g/dL (ref 13.0–17.0)
Immature Granulocytes: 0 %
Lymphocytes Relative: 30 %
Lymphs Abs: 1.6 10*3/uL (ref 0.7–4.0)
MCH: 32 pg (ref 26.0–34.0)
MCHC: 33 g/dL (ref 30.0–36.0)
MCV: 96.9 fL (ref 80.0–100.0)
Monocytes Absolute: 0.6 10*3/uL (ref 0.1–1.0)
Monocytes Relative: 12 %
Neutro Abs: 3 10*3/uL (ref 1.7–7.7)
Neutrophils Relative %: 55 %
Platelets: 403 10*3/uL — ABNORMAL HIGH (ref 150–400)
RBC: 4.53 MIL/uL (ref 4.22–5.81)
RDW: 15.9 % — ABNORMAL HIGH (ref 11.5–15.5)
WBC: 5.4 10*3/uL (ref 4.0–10.5)
nRBC: 0 % (ref 0.0–0.2)

## 2020-02-09 ENCOUNTER — Encounter: Payer: Self-pay | Admitting: Oncology

## 2020-02-09 ENCOUNTER — Inpatient Hospital Stay (HOSPITAL_BASED_OUTPATIENT_CLINIC_OR_DEPARTMENT_OTHER): Payer: PPO | Admitting: Oncology

## 2020-02-09 DIAGNOSIS — Z79899 Other long term (current) drug therapy: Secondary | ICD-10-CM

## 2020-02-09 DIAGNOSIS — D473 Essential (hemorrhagic) thrombocythemia: Secondary | ICD-10-CM | POA: Diagnosis not present

## 2020-02-09 NOTE — Progress Notes (Signed)
Patient stated that he had been doing well with complaints.

## 2020-02-12 NOTE — Progress Notes (Signed)
I connected with Leroy Morton on 02/12/20 at  2:15 PM EST by video enabled telemedicine visit and verified that I am speaking with the correct person using two identifiers.   I discussed the limitations, risks, security and privacy concerns of performing an evaluation and management service by telemedicine and the availability of in-person appointments. I also discussed with the patient that there may be a patient responsible charge related to this service. The patient expressed understanding and agreed to proceed.  Other persons participating in the visit and their role in the encounter:  none  Patient's location:  car Provider's location:  work  Risk analyst Complaint: Routine follow-up of essential thrombocythemia on Hydrea  History of present illness:  patient is a 69 year old gentleman with a past medical history significant for hypertension and GERD referred to [place] for thrombocytosis.Most recent CBC from 11/27/2019 showed platelet count of 651. His prior platelet count from 05/30/2018 was 41. White cell count and hemoglobin have remained normal. Patient denies any changes in his appetite or unintentional weight loss or night sweats. Denies any lumps or bumps anywhere. Denies any prior thromboembolic episodes.  Results of blood work from 12/12/2019 were as follows, CBC showed white count of 6.7, H&H of 13.2/41 with a platelet count of 600. JAK2 mutation testing was positive. CAL R and MPL mutation testing negative. BCR abl fish negative. ESR normal. Iron studies normal.  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  Findings are consistent with myeloproliferative neoplasm most likely essential thrombocythemia  Interval history: Patient is tolerating Hydrea 500 mg twice daily well without any significant side effects.  Reports no fatigue, dizziness, mouth sores   Review of Systems  Constitutional: Negative for chills, fever,  malaise/fatigue and weight loss.  HENT: Negative for congestion, ear discharge and nosebleeds.   Eyes: Negative for blurred vision.  Respiratory: Negative for cough, hemoptysis, sputum production, shortness of breath and wheezing.   Cardiovascular: Negative for chest pain, palpitations, orthopnea and claudication.  Gastrointestinal: Negative for abdominal pain, blood in stool, constipation, diarrhea, heartburn, melena, nausea and vomiting.  Genitourinary: Negative for dysuria, flank pain, frequency, hematuria and urgency.  Musculoskeletal: Negative for back pain, joint pain and myalgias.  Skin: Negative for rash.  Neurological: Negative for dizziness, tingling, focal weakness, seizures, weakness and headaches.  Endo/Heme/Allergies: Does not bruise/bleed easily.  Psychiatric/Behavioral: Negative for depression and suicidal ideas. The patient does not have insomnia.     Allergies  Allergen Reactions  . Other Other (See Comments)    " Fresh " seafood... Nausea, dyspnea    Past Medical History:  Diagnosis Date  . Anxiety   . GERD (gastroesophageal reflux disease)   . Hyperlipidemia   . Personal history of colonic polyps     Past Surgical History:  Procedure Laterality Date  . CATARACT EXTRACTION W/ INTRAOCULAR LENS  IMPLANT, BILATERAL  12/15-R  1/16-- L  . CYST EXCISION  30 years ago   from back  . VASECTOMY      Social History   Socioeconomic History  . Marital status: Married    Spouse name: Not on file  . Number of children: 4  . Years of education: Not on file  . Highest education level: Not on file  Occupational History  . Occupation: Automotive engineer    Comment: [employer] (formerly [employer]  Tobacco Use  . Smoking status: Former Smoker    Types: Cigarettes    Quit date: 12/07/2005    Years since quitting:  14.1  . Smokeless tobacco: Never Used  Substance and Sexual Activity  . Alcohol use: Yes    Alcohol/week: 3.0 standard drinks    Types: 3 Cans of  beer per week  . Drug use: No  . Sexual activity: Yes  Other Topics Concern  . Not on file  Social History Narrative   Has living will   Now with health care POA--- would want wife, then son Gwyndolyn Saxon   Would accept resuscitation   No tube feeds if cognitively unaware   Social Determinants of Health   Financial Resource Strain:   . Difficulty of Paying Living Expenses: Not on file  Food Insecurity:   . Worried About Charity fundraiser in the Last Year: Not on file  . Ran Out of Food in the Last Year: Not on file  Transportation Needs:   . Lack of Transportation (Medical): Not on file  . Lack of Transportation (Non-Medical): Not on file  Physical Activity:   . Days of Exercise per Week: Not on file  . Minutes of Exercise per Session: Not on file  Stress:   . Feeling of Stress : Not on file  Social Connections:   . Frequency of Communication with Friends and Family: Not on file  . Frequency of Social Gatherings with Friends and Family: Not on file  . Attends Religious Services: Not on file  . Active Member of Clubs or Organizations: Not on file  . Attends Archivist Meetings: Not on file  . Marital Status: Not on file  Intimate Partner Violence:   . Fear of Current or Ex-Partner: Not on file  . Emotionally Abused: Not on file  . Physically Abused: Not on file  . Sexually Abused: Not on file    Family History  Problem Relation Age of Onset  . Stroke Father   . Breast cancer Sister        breast cancer  . Arthritis Brother   . CAD Neg Hx   . Diabetes Neg Hx      Current Outpatient Medications:  .  hydroxyurea (HYDREA) 500 MG capsule, Take 1 capsule (500 mg total) by mouth 2 (two) times daily. May take with food to minimize GI side effects., Disp: 60 capsule, Rfl: 3  No results found.  No images are attached to the encounter.   CMP Latest Ref Rng & Units 02/08/2020  Glucose 70 - 99 mg/dL 104(H)  BUN 8 - 23 mg/dL 17  Creatinine 0.61 - 1.24 mg/dL 0.92   Sodium 135 - 145 mmol/L 138  Potassium 3.5 - 5.1 mmol/L 3.9  Chloride 98 - 111 mmol/L 103  CO2 22 - 32 mmol/L 31  Calcium 8.9 - 10.3 mg/dL 9.1  Total Protein 6.5 - 8.1 g/dL 7.5  Total Bilirubin 0.3 - 1.2 mg/dL 0.9  Alkaline Phos 38 - 126 U/L 66  AST 15 - 41 U/L 22  ALT 0 - 44 U/L 19   CBC Latest Ref Rng & Units 02/08/2020  WBC 4.0 - 10.5 K/uL 5.4  Hemoglobin 13.0 - 17.0 g/dL 14.5  Hematocrit 39.0 - 52.0 % 43.9  Platelets 150 - 400 K/uL 403(H)     Observation/objective: Appears in no acute distress over video visit today.  Breathing is nonlabored  Assessment and plan: Patient is a 69 year old male with high risk essential thrombocythemia JAK2 positive on Hydrea here for routine follow-up  Patient is tolerating Hydrea 500 mg twice daily well without any significant side effects.  His  platelet counts did decrease from 689 to 403 today.  Patient will continue with present dose of Hydrea at this time.  He did receive his first Covid vaccine  Follow-up instructions: CBC with differential and CMP in 2 months in 4 months and I will see him back in 4 months  I discussed the assessment and treatment plan with the patient. The patient was provided an opportunity to ask questions and all were answered. The patient agreed with the plan and demonstrated an understanding of the instructions.   The patient was advised to call back or seek an in-person evaluation if the symptoms worsen or if the condition fails to improve as anticipated.    Visit Diagnosis: 1. Essential thrombocythemia ([place])   2. High risk medication use     Dr. Randa Evens, MD, MPH [facility] at [facility] Tel- [phone] 02/12/2020 1:00 PM

## 2020-04-10 ENCOUNTER — Inpatient Hospital Stay: Payer: PPO | Attending: Oncology

## 2020-04-10 ENCOUNTER — Other Ambulatory Visit: Payer: Self-pay

## 2020-04-10 DIAGNOSIS — D473 Essential (hemorrhagic) thrombocythemia: Secondary | ICD-10-CM | POA: Diagnosis not present

## 2020-04-10 LAB — CBC WITH DIFFERENTIAL/PLATELET
Abs Immature Granulocytes: 0.01 10*3/uL (ref 0.00–0.07)
Basophils Absolute: 0.1 10*3/uL (ref 0.0–0.1)
Basophils Relative: 1 %
Eosinophils Absolute: 0.1 10*3/uL (ref 0.0–0.5)
Eosinophils Relative: 1 %
HCT: 39.3 % (ref 39.0–52.0)
Hemoglobin: 13.3 g/dL (ref 13.0–17.0)
Immature Granulocytes: 0 %
Lymphocytes Relative: 33 %
Lymphs Abs: 1.7 10*3/uL (ref 0.7–4.0)
MCH: 35.8 pg — ABNORMAL HIGH (ref 26.0–34.0)
MCHC: 33.8 g/dL (ref 30.0–36.0)
MCV: 105.9 fL — ABNORMAL HIGH (ref 80.0–100.0)
Monocytes Absolute: 0.4 10*3/uL (ref 0.1–1.0)
Monocytes Relative: 9 %
Neutro Abs: 2.9 10*3/uL (ref 1.7–7.7)
Neutrophils Relative %: 56 %
Platelets: 305 10*3/uL (ref 150–400)
RBC: 3.71 MIL/uL — ABNORMAL LOW (ref 4.22–5.81)
RDW: 19.1 % — ABNORMAL HIGH (ref 11.5–15.5)
WBC: 5.1 10*3/uL (ref 4.0–10.5)
nRBC: 0 % (ref 0.0–0.2)

## 2020-04-10 LAB — COMPREHENSIVE METABOLIC PANEL
ALT: 33 U/L (ref 0–44)
AST: 33 U/L (ref 15–41)
Albumin: 4.2 g/dL (ref 3.5–5.0)
Alkaline Phosphatase: 60 U/L (ref 38–126)
Anion gap: 8 (ref 5–15)
BUN: 27 mg/dL — ABNORMAL HIGH (ref 8–23)
CO2: 27 mmol/L (ref 22–32)
Calcium: 8.5 mg/dL — ABNORMAL LOW (ref 8.9–10.3)
Chloride: 105 mmol/L (ref 98–111)
Creatinine, Ser: 0.77 mg/dL (ref 0.61–1.24)
GFR calc Af Amer: >60 mL/min (ref >60)
GFR calc non Af Amer: >60 mL/min (ref >60)
Glucose, Bld: 99 mg/dL (ref 70–99)
Potassium: 4.1 mmol/L (ref 3.5–5.1)
Sodium: 140 mmol/L (ref 135–145)
Total Bilirubin: 0.9 mg/dL (ref 0.3–1.2)
Total Protein: 7 g/dL (ref 6.5–8.1)

## 2020-04-13 ENCOUNTER — Encounter: Payer: Self-pay | Admitting: Oncology

## 2020-05-22 ENCOUNTER — Encounter: Payer: Self-pay | Admitting: Oncology

## 2020-05-28 ENCOUNTER — Other Ambulatory Visit: Payer: Self-pay | Admitting: *Deleted

## 2020-05-28 MED ORDER — HYDROXYUREA 500 MG PO CAPS: 500.0000 mg | ORAL_CAPSULE | Freq: Two times a day (BID) | ORAL | 3 refills | Status: DC

## 2020-06-13 ENCOUNTER — Other Ambulatory Visit: Payer: Self-pay

## 2020-06-13 ENCOUNTER — Inpatient Hospital Stay: Payer: PPO | Attending: Oncology

## 2020-06-13 DIAGNOSIS — I10 Essential (primary) hypertension: Secondary | ICD-10-CM | POA: Insufficient documentation

## 2020-06-13 DIAGNOSIS — Z87891 Personal history of nicotine dependence: Secondary | ICD-10-CM | POA: Diagnosis not present

## 2020-06-13 DIAGNOSIS — D473 Essential (hemorrhagic) thrombocythemia: Secondary | ICD-10-CM

## 2020-06-13 DIAGNOSIS — Z79899 Other long term (current) drug therapy: Secondary | ICD-10-CM | POA: Diagnosis not present

## 2020-06-13 DIAGNOSIS — Z8719 Personal history of other diseases of the digestive system: Secondary | ICD-10-CM | POA: Diagnosis not present

## 2020-06-13 DIAGNOSIS — Z7982 Long term (current) use of aspirin: Secondary | ICD-10-CM | POA: Diagnosis not present

## 2020-06-13 LAB — CBC WITH DIFFERENTIAL/PLATELET
Abs Immature Granulocytes: 0.02 10*3/uL (ref 0.00–0.07)
Basophils Absolute: 0 10*3/uL (ref 0.0–0.1)
Basophils Relative: 1 %
Eosinophils Absolute: 0 10*3/uL (ref 0.0–0.5)
Eosinophils Relative: 1 %
HCT: 40.8 % (ref 39.0–52.0)
Hemoglobin: 14.2 g/dL (ref 13.0–17.0)
Immature Granulocytes: 0 %
Lymphocytes Relative: 26 %
Lymphs Abs: 1.4 10*3/uL (ref 0.7–4.0)
MCH: 37.9 pg — ABNORMAL HIGH (ref 26.0–34.0)
MCHC: 34.8 g/dL (ref 30.0–36.0)
MCV: 108.8 fL — ABNORMAL HIGH (ref 80.0–100.0)
Monocytes Absolute: 0.5 10*3/uL (ref 0.1–1.0)
Monocytes Relative: 9 %
Neutro Abs: 3.4 10*3/uL (ref 1.7–7.7)
Neutrophils Relative %: 63 %
Platelets: 345 10*3/uL (ref 150–400)
RBC: 3.75 MIL/uL — ABNORMAL LOW (ref 4.22–5.81)
RDW: 11.4 % — ABNORMAL LOW (ref 11.5–15.5)
WBC: 5.4 10*3/uL (ref 4.0–10.5)
nRBC: 0 % (ref 0.0–0.2)

## 2020-06-13 LAB — COMPREHENSIVE METABOLIC PANEL
ALT: 24 U/L (ref 0–44)
AST: 26 U/L (ref 15–41)
Albumin: 4.3 g/dL (ref 3.5–5.0)
Alkaline Phosphatase: 64 U/L (ref 38–126)
Anion gap: 8 (ref 5–15)
BUN: 21 mg/dL (ref 8–23)
CO2: 28 mmol/L (ref 22–32)
Calcium: 8.6 mg/dL — ABNORMAL LOW (ref 8.9–10.3)
Chloride: 103 mmol/L (ref 98–111)
Creatinine, Ser: 0.99 mg/dL (ref 0.61–1.24)
GFR calc Af Amer: >60 mL/min (ref >60)
GFR calc non Af Amer: >60 mL/min (ref >60)
Glucose, Bld: 98 mg/dL (ref 70–99)
Potassium: 4 mmol/L (ref 3.5–5.1)
Sodium: 139 mmol/L (ref 135–145)
Total Bilirubin: 0.9 mg/dL (ref 0.3–1.2)
Total Protein: 7.6 g/dL (ref 6.5–8.1)

## 2020-06-14 ENCOUNTER — Inpatient Hospital Stay: Payer: PPO

## 2020-06-14 ENCOUNTER — Encounter: Payer: Self-pay | Admitting: Oncology

## 2020-06-14 ENCOUNTER — Inpatient Hospital Stay (HOSPITAL_BASED_OUTPATIENT_CLINIC_OR_DEPARTMENT_OTHER): Payer: PPO | Admitting: Oncology

## 2020-06-14 DIAGNOSIS — D473 Essential (hemorrhagic) thrombocythemia: Secondary | ICD-10-CM | POA: Diagnosis not present

## 2020-06-14 DIAGNOSIS — Z79899 Other long term (current) drug therapy: Secondary | ICD-10-CM | POA: Diagnosis not present

## 2020-06-14 NOTE — Progress Notes (Signed)
Patient called/ pre- screened for virtual appoinment today with oncologist. No concerns or complaints. 

## 2020-06-14 NOTE — Progress Notes (Signed)
I connected with Leroy Morton on 06/14/20 at 10:30 AM EDT by video enabled telemedicine visit and verified that I am speaking with the correct person using two identifiers.  There were problems during video visit and inability to connect successfully.  It was switched to a telephone visit   I discussed the limitations, risks, security and privacy concerns of performing an evaluation and management service by telemedicine and the availability of in-person appointments. I also discussed with the patient that there may be a patient responsible charge related to this service. The patient expressed understanding and agreed to proceed.  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 His prior platelet count from 05/30/2018 was 41. White cell count and hemoglobin have remained normal. Patient denies any changes in his appetite or unintentional weight loss or night sweats. Denies any lumps or bumps anywhere. Denies any prior thromboembolic episodes.  Results of blood work from 12/12/2019 were as follows, CBC showed white count of 6.7, H&H of 13.2/41 with a platelet count of 600. JAK2 mutation testing was positive. CAL R and MPL mutation testing negative. BCR abl fish negative. ESR normal. Iron studies normal.  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  Patient was started on Hydrea 500 mg daily   Interval history patient reports tolerating Hydrea  well without any significant side effects.  He remains active and goes to work every day.  Denies any dizziness, diarrhea, mouth ulcers or leg ulcers.   Review of Systems  Constitutional: Negative for chills, fever, malaise/fatigue and weight loss.  HENT: Negative for congestion, ear discharge and nosebleeds.   Eyes: Negative for blurred vision.  Respiratory: Negative for cough, hemoptysis, sputum production, shortness of breath and wheezing.   Cardiovascular: Negative for chest pain, palpitations, orthopnea and claudication.  Gastrointestinal: Negative for abdominal pain, blood in stool, constipation, diarrhea, heartburn, melena, nausea and vomiting.  Genitourinary: Negative for dysuria, flank pain, frequency, hematuria and urgency.  Musculoskeletal: Negative for back pain, joint pain and myalgias.  Skin: Negative for rash.  Neurological: Negative for dizziness, tingling, focal weakness, seizures, weakness and headaches.  Endo/Heme/Allergies: Does not bruise/bleed easily.  Psychiatric/Behavioral: Negative for depression and suicidal ideas. The patient does not have insomnia.     Allergies  Allergen Reactions  . Other Other (See Comments)    " Fresh " seafood... Nausea, dyspnea    Past Medical History:  Diagnosis Date  . Anxiety   . GERD (gastroesophageal reflux disease)   . Hyperlipidemia   . Personal history of colonic polyps     Past Surgical History:  Procedure Laterality Date  . CATARACT EXTRACTION W/ INTRAOCULAR LENS  IMPLANT, BILATERAL  12/15-R  1/16-- L  . CYST EXCISION  30 years ago   from back  . VASECTOMY      Social History   Socioeconomic History  . Marital status: Married    Spouse name: Not on file  . Number of children: 4  . Years of education: Not on file  . Highest education level: Not on file  Occupational History  . Occupation:  Truss designer    Comment: [employer]  Tobacco Use  . Smoking status: Former Smoker     Types: Cigarettes    Quit date: 12/07/2005    Years since quitting: 14.5  . Smokeless tobacco: Never Used  Vaping Use  . Vaping Use: Never used  Substance and Sexual Activity  . Alcohol use: Yes    Alcohol/week: 3.0 standard drinks    Types: 3 Cans of beer per week  . Drug use: No  . Sexual activity: Yes  Other Topics Concern  . Not on file  Social History Narrative   Has living will   Now with health care POA--- would want wife, then son Gwyndolyn Saxon   Would accept resuscitation   No tube feeds if cognitively unaware   Social Determinants of Health   Financial Resource Strain:   . Difficulty of Paying Living Expenses:   Food Insecurity:   . Worried About Charity fundraiser in the Last Year:   . Arboriculturist in the Last Year:   Transportation Needs:   . Film/video editor (Medical):   Marland Kitchen Lack of Transportation (Non-Medical):   Physical Activity:   . Days of Exercise per Week:   . Minutes of Exercise per Session:   Stress:   . Feeling of Stress :   Social Connections:   . Frequency of Communication with Friends and Family:   . Frequency of Social Gatherings with Friends and Family:   . Attends Religious Services:   . Active Member of Clubs or Organizations:   . Attends Archivist Meetings:   Marland Kitchen Marital Status:   Intimate Partner Violence:   . Fear of Current or Ex-Partner:   . Emotionally Abused:   Marland Kitchen Physically Abused:   . Sexually Abused:     Family History  Problem Relation Age of Onset  . Stroke Father   . Breast cancer Sister        breast cancer  . Arthritis Brother   . CAD Neg Hx   . Diabetes Neg Hx      Current Outpatient Medications:  .  aspirin 81 MG chewable tablet, Chew by mouth daily., Disp: , Rfl:  .  hydroxyurea (HYDREA) 500 MG capsule, Take 1 capsule (500 mg total) by mouth 2 (two) times daily. May take with food to minimize GI side effects., Disp: 60 capsule, Rfl: 3  No results found.  No images are attached to the  encounter.   CMP Latest Ref Rng & Units 06/13/2020  Glucose 70 - 99 mg/dL 98  BUN 8 - 23 mg/dL 21  Creatinine 0.61 - 1.24 mg/dL 0.99  Sodium 135 - 145 mmol/L 139  Potassium 3.5 - 5.1 mmol/L 4.0  Chloride 98 - 111 mmol/L 103  CO2 22 - 32 mmol/L 28  Calcium 8.9 - 10.3 mg/dL 8.6(L)  Total Protein 6.5 - 8.1 g/dL 7.6  Total Bilirubin 0.3 - 1.2 mg/dL 0.9  Alkaline Phos 38 - 126 U/L 64  AST 15 - 41 U/L 26  ALT 0 - 44 U/L 24   CBC Latest Ref Rng & Units 06/13/2020  WBC 4.0 - 10.5 K/uL 5.4  Hemoglobin 13.0 - 17.0 g/dL 14.2  Hematocrit 39 - 52 % 40.8  Platelets 150 - 400 K/uL 345     Assessment and plan: Patient is a 69 year old male with history of 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  His platelet counts are at goal less than 400 and white count and Hemoglobin is normal.  Macrocytosis likely secondary to Hydrea.  He will also continue low-dose aspirin every day.  Follow-up instructions: CBC with differential and CMP in 3 in 6 months and I will see him back in 6 months in person  I discussed the assessment and treatment plan with the patient. The patient was provided an opportunity to ask questions and all were answered. The patient agreed with the plan and demonstrated an understanding of the instructions.   The patient was advised to call back or seek an in-person evaluation if the symptoms worsen or if the condition fails to improve as anticipated.    Visit Diagnosis: 1. Essential thrombocythemia ([place])   2. High risk medication use     Dr. Randa Evens, MD, MPH [facility] at [facility] Tel- [phone] 06/14/2020 11:52 AM

## 2020-08-31 ENCOUNTER — Other Ambulatory Visit: Payer: Self-pay | Admitting: Oncology

## 2020-09-12 ENCOUNTER — Other Ambulatory Visit: Payer: Self-pay

## 2020-09-12 ENCOUNTER — Inpatient Hospital Stay: Payer: PPO | Attending: Oncology

## 2020-09-12 DIAGNOSIS — D473 Essential (hemorrhagic) thrombocythemia: Secondary | ICD-10-CM | POA: Insufficient documentation

## 2020-09-12 DIAGNOSIS — Z79899 Other long term (current) drug therapy: Secondary | ICD-10-CM

## 2020-09-12 LAB — CBC WITH DIFFERENTIAL/PLATELET
Abs Immature Granulocytes: 0.01 10*3/uL (ref 0.00–0.07)
Basophils Absolute: 0 10*3/uL (ref 0.0–0.1)
Basophils Relative: 1 %
Eosinophils Absolute: 0.1 10*3/uL (ref 0.0–0.5)
Eosinophils Relative: 1 %
HCT: 40.4 % (ref 39.0–52.0)
Hemoglobin: 13.9 g/dL (ref 13.0–17.0)
Immature Granulocytes: 0 %
Lymphocytes Relative: 28 %
Lymphs Abs: 1.5 10*3/uL (ref 0.7–4.0)
MCH: 38.4 pg — ABNORMAL HIGH (ref 26.0–34.0)
MCHC: 34.4 g/dL (ref 30.0–36.0)
MCV: 111.6 fL — ABNORMAL HIGH (ref 80.0–100.0)
Monocytes Absolute: 0.5 10*3/uL (ref 0.1–1.0)
Monocytes Relative: 9 %
Neutro Abs: 3.2 10*3/uL (ref 1.7–7.7)
Neutrophils Relative %: 61 %
Platelets: 304 10*3/uL (ref 150–400)
RBC: 3.62 MIL/uL — ABNORMAL LOW (ref 4.22–5.81)
RDW: 13.6 % (ref 11.5–15.5)
WBC: 5.2 10*3/uL (ref 4.0–10.5)
nRBC: 0 % (ref 0.0–0.2)

## 2020-09-12 LAB — COMPREHENSIVE METABOLIC PANEL
ALT: 19 U/L (ref 0–44)
AST: 26 U/L (ref 15–41)
Albumin: 4.5 g/dL (ref 3.5–5.0)
Alkaline Phosphatase: 56 U/L (ref 38–126)
Anion gap: 10 (ref 5–15)
BUN: 25 mg/dL — ABNORMAL HIGH (ref 8–23)
CO2: 26 mmol/L (ref 22–32)
Calcium: 8.7 mg/dL — ABNORMAL LOW (ref 8.9–10.3)
Chloride: 104 mmol/L (ref 98–111)
Creatinine, Ser: 0.97 mg/dL (ref 0.61–1.24)
GFR calc non Af Amer: >60 mL/min (ref >60)
Glucose, Bld: 101 mg/dL — ABNORMAL HIGH (ref 70–99)
Potassium: 4.2 mmol/L (ref 3.5–5.1)
Sodium: 140 mmol/L (ref 135–145)
Total Bilirubin: 0.9 mg/dL (ref 0.3–1.2)
Total Protein: 7.7 g/dL (ref 6.5–8.1)

## 2020-09-13 ENCOUNTER — Other Ambulatory Visit: Payer: PPO

## 2020-09-30 ENCOUNTER — Encounter: Payer: Self-pay | Admitting: Oncology

## 2020-10-01 ENCOUNTER — Other Ambulatory Visit: Payer: Self-pay | Admitting: Oncology

## 2020-10-01 MED ORDER — HYDROXYUREA 500 MG PO CAPS: ORAL_CAPSULE | ORAL | 3 refills | Status: DC

## 2020-11-25 IMAGING — CT CT BIOPSY AND ASPIRATION BONE MARROW
2 of 4 series · 5 of 14 positions shown, 6 images · non-contrast
Comparison: none

INDICATION: 68-year-old with JAK2 positive thrombocytosis. Request for bone
marrow biopsy. Patient also has a palpable lesion in the mid back
discovered during the pre procedure examination.

[Series 3: i-spiral 5.0 b30f · axial · 0.39mm/px · z∈[-136,-105]mm · 2 of 27 slices shown (1 of 2)]
[im 9/27  bone]
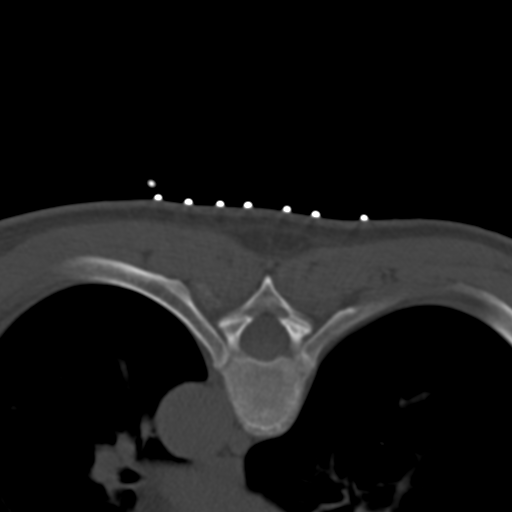
[im 18/27  bone]
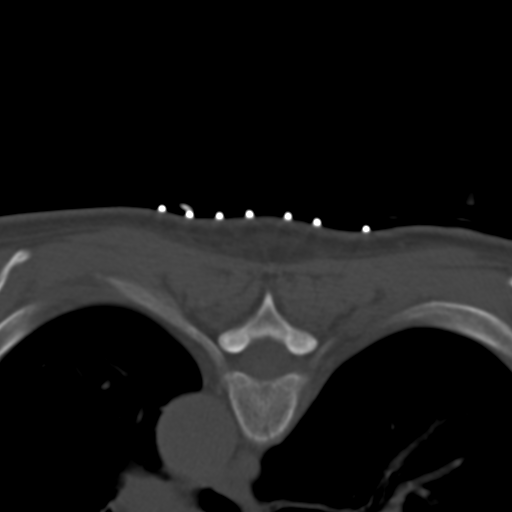

[Series 4: i-spiral 5.0 b30f · axial · 0.90mm/px · z∈[-493,-437]mm · 3 of 34 slices shown, 4 images (2 of 2)]
[im 9/34  soft-tissue]
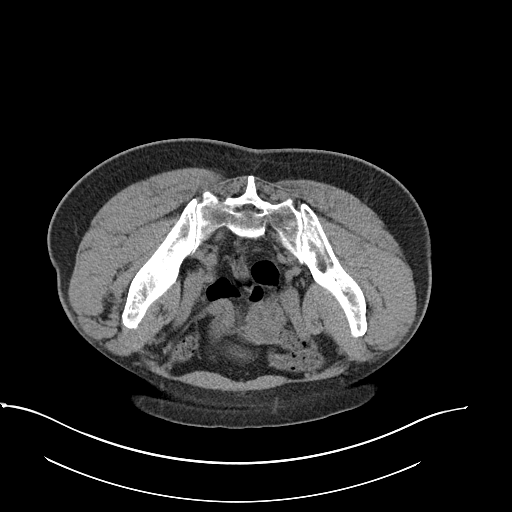
[im 9/34  bone]
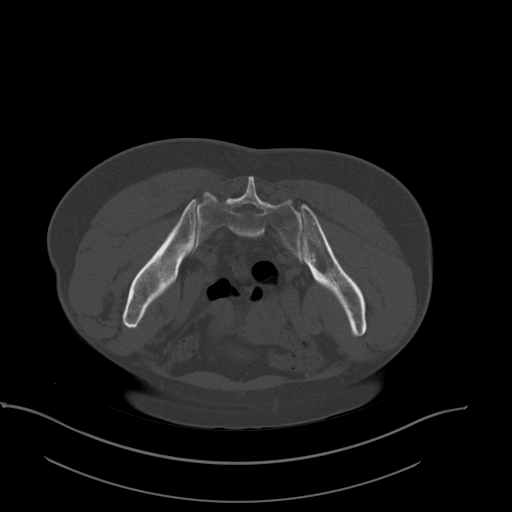
[im 17/34  bone]
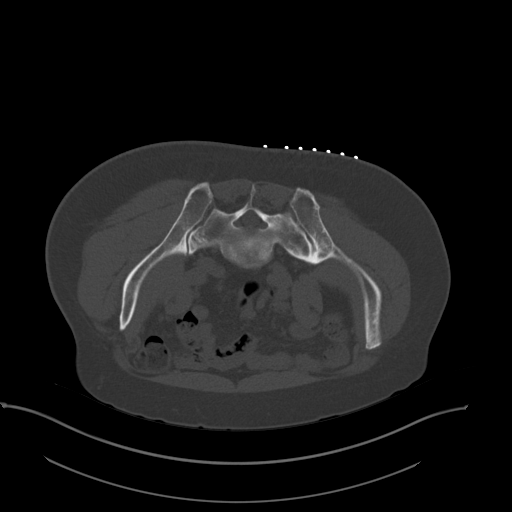
[im 25/34  bone]
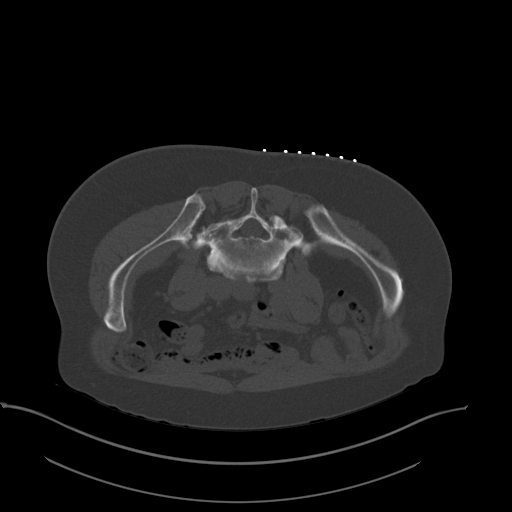

[5 of 14 positions shown; findings below may reference images not displayed]

EXAM:
CT GUIDED BONE MARROW ASPIRATES AND BIOPSY

MEDICATIONS:
None.

ANESTHESIA/SEDATION:
Fentanyl 100 mcg IV; Versed 2.0 mg IV

Moderate Sedation Time:  14 minutes

The patient was continuously monitored during the procedure by the
interventional radiology nurse under my direct supervision.

COMPLICATIONS:
None immediate.

PROCEDURE:
The procedure was explained to the patient. The risks and benefits
of the procedure were discussed and the patient's questions were
addressed. Informed consent was obtained from the patient. The
patient was placed prone on CT table. The area of concern in the mid
back was marked and small set of images were obtained through this
area to evaluate. Subsequently, images of the pelvis were obtained.
The right side of back was prepped and draped in sterile fashion.
The skin and right posterior ilium were anesthetized with 1%
lidocaine. 11 gauge bone needle was directed into the right ilium
with CT guidance. Two aspirates and two core biopsies were obtained.
Bandage placed over the puncture site.
FINDINGS: At the area of concern in the midthoracic spine, there is a
superficial fat attenuating lesion located just superficial to the
musculatures and overlying the spinous process. Structure measures
4.1 x 1.0 cm. Findings are most compatible with a subcutaneous
lipoma.

Bone needle was directed into the posterior right ilium. Two
aspirates and two core biopsies were obtained.
IMPRESSION: 1. Successful CT-guided bone marrow biopsy. Specimens obtained from
the right iliac bone.
2. The palpable area of concern in the midthoracic spine is
compatible with a fatty lesion. Findings are most compatible with a
focal subcutaneous lipoma.

## 2020-12-09 ENCOUNTER — Inpatient Hospital Stay: Payer: Medicare Other | Attending: Oncology

## 2020-12-09 ENCOUNTER — Other Ambulatory Visit: Payer: Self-pay

## 2020-12-09 ENCOUNTER — Encounter: Payer: Self-pay | Admitting: Internal Medicine

## 2020-12-09 ENCOUNTER — Ambulatory Visit (INDEPENDENT_AMBULATORY_CARE_PROVIDER_SITE_OTHER): Payer: Medicare Other | Admitting: Internal Medicine

## 2020-12-09 VITALS — BP 140/82 | HR 58 | Temp 96.8°F | Ht 67.5 in | Wt 211.0 lb

## 2020-12-09 DIAGNOSIS — Z1589 Genetic susceptibility to other disease: Secondary | ICD-10-CM

## 2020-12-09 DIAGNOSIS — D7589 Other specified diseases of blood and blood-forming organs: Secondary | ICD-10-CM | POA: Diagnosis not present

## 2020-12-09 DIAGNOSIS — Z7189 Other specified counseling: Secondary | ICD-10-CM | POA: Diagnosis not present

## 2020-12-09 DIAGNOSIS — Z23 Encounter for immunization: Secondary | ICD-10-CM

## 2020-12-09 DIAGNOSIS — Z87891 Personal history of nicotine dependence: Secondary | ICD-10-CM | POA: Insufficient documentation

## 2020-12-09 DIAGNOSIS — K219 Gastro-esophageal reflux disease without esophagitis: Secondary | ICD-10-CM

## 2020-12-09 DIAGNOSIS — I10 Essential (primary) hypertension: Secondary | ICD-10-CM | POA: Insufficient documentation

## 2020-12-09 DIAGNOSIS — Z0000 Encounter for general adult medical examination without abnormal findings: Secondary | ICD-10-CM | POA: Diagnosis not present

## 2020-12-09 DIAGNOSIS — D473 Essential (hemorrhagic) thrombocythemia: Secondary | ICD-10-CM

## 2020-12-09 DIAGNOSIS — R5382 Chronic fatigue, unspecified: Secondary | ICD-10-CM | POA: Diagnosis not present

## 2020-12-09 DIAGNOSIS — G8929 Other chronic pain: Secondary | ICD-10-CM | POA: Diagnosis not present

## 2020-12-09 DIAGNOSIS — Z7982 Long term (current) use of aspirin: Secondary | ICD-10-CM | POA: Insufficient documentation

## 2020-12-09 DIAGNOSIS — Z8719 Personal history of other diseases of the digestive system: Secondary | ICD-10-CM | POA: Insufficient documentation

## 2020-12-09 DIAGNOSIS — Z79899 Other long term (current) drug therapy: Secondary | ICD-10-CM | POA: Insufficient documentation

## 2020-12-09 LAB — CBC WITH DIFFERENTIAL/PLATELET
Abs Immature Granulocytes: 0.02 10*3/uL (ref 0.00–0.07)
Basophils Absolute: 0 10*3/uL (ref 0.0–0.1)
Basophils Relative: 1 %
Eosinophils Absolute: 0.1 10*3/uL (ref 0.0–0.5)
Eosinophils Relative: 1 %
HCT: 39 % (ref 39.0–52.0)
Hemoglobin: 13.7 g/dL (ref 13.0–17.0)
Immature Granulocytes: 0 %
Lymphocytes Relative: 35 %
Lymphs Abs: 1.6 10*3/uL (ref 0.7–4.0)
MCH: 40.2 pg — ABNORMAL HIGH (ref 26.0–34.0)
MCHC: 35.1 g/dL (ref 30.0–36.0)
MCV: 114.4 fL — ABNORMAL HIGH (ref 80.0–100.0)
Monocytes Absolute: 0.4 10*3/uL (ref 0.1–1.0)
Monocytes Relative: 9 %
Neutro Abs: 2.4 10*3/uL (ref 1.7–7.7)
Neutrophils Relative %: 54 %
Platelets: 281 10*3/uL (ref 150–400)
RBC: 3.41 MIL/uL — ABNORMAL LOW (ref 4.22–5.81)
RDW: 11.9 % (ref 11.5–15.5)
WBC: 4.5 10*3/uL (ref 4.0–10.5)
nRBC: 0 % (ref 0.0–0.2)

## 2020-12-09 LAB — COMPREHENSIVE METABOLIC PANEL
ALT: 21 U/L (ref 0–44)
AST: 25 U/L (ref 15–41)
Albumin: 4.5 g/dL (ref 3.5–5.0)
Alkaline Phosphatase: 58 U/L (ref 38–126)
Anion gap: 5 (ref 5–15)
BUN: 21 mg/dL (ref 8–23)
CO2: 31 mmol/L (ref 22–32)
Calcium: 8.8 mg/dL — ABNORMAL LOW (ref 8.9–10.3)
Chloride: 103 mmol/L (ref 98–111)
Creatinine, Ser: 1.04 mg/dL (ref 0.61–1.24)
GFR, Estimated: >60 mL/min (ref >60)
Glucose, Bld: 104 mg/dL — ABNORMAL HIGH (ref 70–99)
Potassium: 3.5 mmol/L (ref 3.5–5.1)
Sodium: 139 mmol/L (ref 135–145)
Total Bilirubin: 0.6 mg/dL (ref 0.3–1.2)
Total Protein: 7.4 g/dL (ref 6.5–8.1)

## 2020-12-09 LAB — PSA: Prostatic Specific Antigen: 0.59 ng/mL (ref 0.00–4.00)

## 2020-12-09 NOTE — Addendum Note (Signed)
Addended by: Eual Fines on: 12/09/2020 03:56 PM   Modules accepted: Orders

## 2020-12-09 NOTE — Assessment & Plan Note (Addendum)
See social history 

## 2020-12-09 NOTE — Progress Notes (Signed)
Subjective:    Patient ID: Leroy Harness Sr., male    DOB: 03-04-1951, 70 y.o.   MRN: [id number]  HPI Here for Medicare wellness visit and follow up of chronic health conditions This visit occurred during the SARS-CoV-2 public health emergency.  Safety protocols were in place, including screening questions prior to the visit, additional usage of staff PPE, and extensive cleaning of exam room while observing appropriate contact time as indicated for disinfecting solutions.   Reviewed form and advanced directives Reviewed other doctors Will have occasional beer No tobacco Regular exercise Vision is fine---needs exam Hearing is not great---may consider hearing aides No falls No depression or anhedonia Independent with instrumental ADLs No sig memory issues  Put on weight over the holidays Past 6 weeks has been having too much bread, etc Will work on better eating again  Did see Dr Smith Mate --started on hydrea Platelets now in normal range  No recent heartburn without meds No dysphagia  Still doesn't sleep great No meds for this  Current Outpatient Medications on File Prior to Visit  Medication Sig Dispense Refill  . aspirin 81 MG chewable tablet Chew by mouth daily.    . B Complex Vitamins (B-COMPLEX/B-12 PO) Take by mouth.    . hydroxyurea (HYDREA) 500 MG capsule TAKE 1 CAPSULE BY MOUTH TWICE DAILY WITHFOOD TO MINIMIZE GI SIDE EFFECTS 60 capsule 3  . Omega-3 Fatty Acids (OMEGA 3 PO) Take by mouth.    Marland Kitchen VITAMIN D, CHOLECALCIFEROL, PO Take by mouth.     No current facility-administered medications on file prior to visit.    Allergies  Allergen Reactions  . Other Other (See Comments)    " Fresh " seafood... Nausea, dyspnea    Past Medical History:  Diagnosis Date  . Anxiety   . GERD (gastroesophageal reflux disease)   . Hyperlipidemia   . Personal history of colonic polyps     Past Surgical History:  Procedure Laterality Date  . CATARACT EXTRACTION W/ INTRAOCULAR  LENS  IMPLANT, BILATERAL  12/15-R  1/16-- L  . CYST EXCISION  30 years ago   from back  . VASECTOMY      Family History  Problem Relation Age of Onset  . Stroke Father   . Breast cancer Sister        breast cancer  . Arthritis Brother   . CAD Neg Hx   . Diabetes Neg Hx     Social History   Socioeconomic History  . Marital status: Married    Spouse name: Not on file  . Number of children: 4  . Years of education: Not on file  . Highest education level: Not on file  Occupational History  . Occupation: Teacher, English as a foreign language    Comment: [employer]formerly [employer]  Tobacco Use  . Smoking status: Former Smoker    Types: Cigarettes    Quit date: 12/07/2005    Years since quitting: 15.0  . Smokeless tobacco: Never Used  Vaping Use  . Vaping Use: Never used  Substance and Sexual Activity  . Alcohol use: Yes    Alcohol/week: 3.0 standard drinks    Types: 3 Cans of beer per week  . Drug use: No  . Sexual activity: Yes  Other Topics Concern  . Not on file  Social History Narrative   Has living will   Now with health care POA--- would want wife, then son [name]   Would accept resuscitation   No tube feeds if  cognitively unaware   Social Determinants of Health   Financial Resource Strain: Not on file  Food Insecurity: Not on file  Transportation Needs: Not on file  Physical Activity: Not on file  Stress: Not on file  Social Connections: Not on file  Intimate Partner Violence: Not on file   Review of Systems Appetite is fine Wears seat belt Has some bone loss in teeth--keeps up with dentist No suspicious skin lesions Bowels are fine---no blood Good urine flow. Nocturia x 1 usually No sig back or joint pain No chest pain or SOB No dizziness or syncope No palpitations No abnormal bleeding or bruising    Objective:   Physical Exam Constitutional:      Appearance: Normal appearance.  HENT:     Mouth/Throat:     Comments: No lesions Eyes:      Conjunctiva/sclera: Conjunctivae normal.     Pupils: Pupils are equal, round, and reactive to light.  Cardiovascular:     Rate and Rhythm: Normal rate and regular rhythm.     Pulses: Normal pulses.     Heart sounds: No murmur heard. No gallop.   Pulmonary:     Effort: Pulmonary effort is normal.     Breath sounds: Normal breath sounds. No wheezing or rales.  Abdominal:     Palpations: Abdomen is soft.     Tenderness: There is no abdominal tenderness.  Musculoskeletal:     Cervical back: Neck supple.     Right lower leg: No edema.     Left lower leg: No edema.  Lymphadenopathy:     Cervical: No cervical adenopathy.  Skin:    General: Skin is warm.     Findings: No rash.  Neurological:     Mental Status: He is alert and oriented to person, place, and time.     Comments: President---"Biden, Trump, Obama" [id number] Recall 3/3  Psychiatric:        Mood and Affect: Mood normal.        Behavior: Behavior normal.            Assessment & Plan:

## 2020-12-09 NOTE — Assessment & Plan Note (Addendum)
I have personally reviewed the Medicare Annual Wellness questionnaire and have noted 1. The patient's medical and social history 2. Their use of alcohol, tobacco or illicit drugs 3. Their current medications and supplements 4. The patient's functional ability including ADL's, fall risks, home safety risks and hearing or visual             impairment. 5. Diet and physical activities 6. Evidence for depression or mood disorders  The patients weight, height, BMI and visual acuity have been recorded in the chart I have made referrals, counseling and provided education to the patient based review of the above and I have provided the pt with a written personalized care plan for preventive services.  I have provided you with a copy of your personalized plan for preventive services. Please take the time to review along with your updated medication list.  Flu vaccine today Had COVID booster Will rededicate to exercise Will consider the shingrix at pharmacy Will check 1 last PSA--- Dr Smith Jayvian will add to labs Colonoscopy due 2024

## 2020-12-09 NOTE — Progress Notes (Signed)
Hearing Screening   125Hz  250Hz  500Hz  1000Hz  2000Hz  3000Hz  4000Hz  6000Hz  8000Hz   Right ear:   20 20 20  20     Left ear:   25 25 25   0      Visual Acuity Screening   Right eye Left eye Both eyes  Without correction: 20/25 20/30 20/30   With correction:

## 2020-12-09 NOTE — Assessment & Plan Note (Signed)
Under control with hydrea

## 2020-12-09 NOTE — Assessment & Plan Note (Addendum)
No recent symptoms. No medications now

## 2020-12-11 NOTE — Addendum Note (Signed)
Addended by: Eual Fines on: 12/11/2020 03:22 PM   Modules accepted: Orders

## 2020-12-15 ENCOUNTER — Other Ambulatory Visit: Payer: Self-pay | Admitting: *Deleted

## 2020-12-15 DIAGNOSIS — Z79899 Other long term (current) drug therapy: Secondary | ICD-10-CM

## 2020-12-15 DIAGNOSIS — D473 Essential (hemorrhagic) thrombocythemia: Secondary | ICD-10-CM

## 2020-12-15 NOTE — Progress Notes (Unsigned)
Hematology/Oncology Consult note [facility]  [phone] [phone]  Patient Care Team: Venia Carbon, MD as PCP - General (Internal Medicine) Christene Lye, MD (General Surgery) Bryson Ha, OD as Referring Physician (Optometry)   Name of the patient: Leroy Morton  [id number]  10/09/1951   Date of visit: 12/15/20  Diagnosis- jak 2 positive high risk ET  Chief complaint/ Reason for visit- routine f/u of ET on hydrea  Heme/Onc history: patient is a 70 year old gentleman with a past medical history significant for hypertension and GERD referred to [place] for thrombocytosis.Most recent CBC from 11/27/2019 showed platelet count of 651. His prior platelet count from 05/30/2018 was 41. White cell count and hemoglobin have remained normal. Patient denies any changes in his appetite or unintentional weight loss or night sweats. Denies any lumps or bumps anywhere. Denies any prior thromboembolic episodes.  Results of blood work from 12/12/2019 were as follows, CBC showed white count of 6.7, H&H of 13.2/41 with a platelet count of 600. JAK2 mutation testing was positive. CAL R and MPL mutation testing negative. BCR abl fish negative. ESR normal. Iron studies normal.  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  Patient was started on Hydrea 500 mg daily   Interval history-patient is doing well and is in mild chronic fatigue he denies other complaints at this time  ECOG PS- 1 Pain scale- 0 Opioid associated constipation- no  Review of systems- Review of Systems  Constitutional: Negative for chills, fever, malaise/fatigue and weight loss.  HENT: Negative for congestion, ear discharge and nosebleeds.   Eyes: Negative for blurred vision.  Respiratory: Negative for cough,  hemoptysis, sputum production, shortness of breath and wheezing.   Cardiovascular: Negative for chest pain, palpitations, orthopnea and claudication.  Gastrointestinal: Negative for abdominal pain, blood in stool, constipation, diarrhea, heartburn, melena, nausea and vomiting.  Genitourinary: Negative for dysuria, flank pain, frequency, hematuria and urgency.  Musculoskeletal: Negative for back pain, joint pain and myalgias.  Skin: Negative for rash.  Neurological: Negative for dizziness, tingling, focal weakness, seizures, weakness and headaches.  Endo/Heme/Allergies: Does not bruise/bleed easily.  Psychiatric/Behavioral: Negative for depression and suicidal ideas. The patient does not have insomnia.        Allergies  Allergen Reactions  . Other Other (See Comments)    " Fresh " seafood... Nausea, dyspnea     Past Medical History:  Diagnosis Date  . Anxiety   . GERD (gastroesophageal reflux disease)   . Hyperlipidemia   . Personal history of colonic polyps      Past Surgical History:  Procedure Laterality Date  . CATARACT EXTRACTION W/ INTRAOCULAR LENS  IMPLANT, BILATERAL  12/15-R  1/16-- L  . CYST EXCISION  30 years ago   from back  . VASECTOMY      Social History   Socioeconomic History  . Marital status: Married    Spouse name: Not on file  . Number of children: 4  . Years of education: Not on file  . Highest education level: Not on file  Occupational History  . Occupation: Automotive engineer    Comment: [employer]formerly [employer]  Tobacco Use  . Smoking status: Former Smoker    Types: Cigarettes    Quit date: 12/07/2005    Years since quitting: 15.0  . Smokeless tobacco: Never Used  Vaping Use  . Vaping Use: Never used  Substance and Sexual Activity  . Alcohol use: Yes    Alcohol/week: 3.0 standard drinks    Types: 3 Cans of beer per week  . Drug use: No  . Sexual activity: Yes  Other Topics Concern  . Not on file  Social History  Narrative   Has living will   Now with health care POA--- would want wife, then son Gwyndolyn Saxon   Would accept resuscitation   No tube feeds if cognitively unaware   Social Determinants of Health   Financial Resource Strain: Not on file  Food Insecurity: Not on file  Transportation Needs: Not on file  Physical Activity: Not on file  Stress: Not on file  Social Connections: Not on file  Intimate Partner Violence: Not on file    Family History  Problem Relation Age of Onset  . Stroke Father   . Breast cancer Sister        breast cancer  . Arthritis Brother   . CAD Neg Hx   . Diabetes Neg Hx      Current Outpatient Medications:  .  aspirin 81 MG chewable tablet, Chew by mouth daily., Disp: , Rfl:  .  cholecalciferol (VITAMIN D3) 25 MCG (1000 UNIT) tablet, Take 1,000 Units by mouth daily., Disp: , Rfl:  .  Cyanocobalamin (B-12) 5000 MCG SUBL, Place under the tongue., Disp: , Rfl:  .  hydroxyurea (HYDREA) 500 MG capsule, TAKE 1 CAPSULE BY MOUTH TWICE DAILY WITHFOOD TO MINIMIZE GI SIDE EFFECTS, Disp: 60 capsule, Rfl: 3 .  Omega-3 Fatty Acids (OMEGA 3 500 PO), Take by mouth., Disp: , Rfl:   Physical exam:  Vitals:   12/16/20 1029  BP: (!) 158/78  Pulse: 63  Resp: 20  Temp: 98.1 F (36.7 C)  TempSrc: Tympanic  SpO2: 100%  Weight: 208 lb 6.4 oz (94.5 kg)   Physical Exam Eyes:     Extraocular Movements: EOM normal.  Cardiovascular:     Rate and Rhythm: Normal rate and regular rhythm.     Heart sounds: Normal heart sounds.  Pulmonary:     Effort: Pulmonary effort is normal.     Breath sounds: Normal breath sounds.  Abdominal:     General: Bowel sounds are normal.     Palpations: Abdomen is soft.     Comments: No palpable hepatosplenomegaly  Skin:    General: Skin is warm and dry.  Neurological:     Mental Status: He is alert and oriented to person, place, and time.      CMP Latest Ref Rng & Units 12/09/2020  Glucose 70 - 99 mg/dL 104(H)  BUN 8 - 23 mg/dL 21   Creatinine 0.61 - 1.24 mg/dL 1.04  Sodium 135 - 145 mmol/L 139  Potassium 3.5 - 5.1 mmol/L 3.5  Chloride 98 - 111 mmol/L 103  CO2 22 - 32 mmol/L 31  Calcium 8.9 - 10.3 mg/dL 8.8(L)  Total Protein 6.5 - 8.1 g/dL 7.4  Total Bilirubin 0.3 - 1.2 mg/dL 0.6  Alkaline Phos 38 - 126 U/L 58  AST 15 - 41 U/L 25  ALT 0 - 44 U/L 21   CBC Latest Ref Rng & Units 12/09/2020  WBC 4.0 - 10.5 K/uL 4.5  Hemoglobin 13.0 - 17.0 g/dL 13.7  Hematocrit 39.0 - 52.0 % 39.0  Platelets 150 - 400 K/uL 281     Assessment and plan- Patient is a 70 y.o. male with h/o high risk JAK2 positive ET on hydrea here for routine f/u  Patient  is tolerating Hydrea well without any significant side effects.  White count hemoglobin was within normal limits and platelets are at target less than 400.    Macrocytosis secondary to Hydrea.  I explained to the patient that B12 supplementation will not help resolve the macrocytosis.   Visit Diagnosis 1. High risk medication use   2. Essential thrombocytosis ([place])      Dr. Randa Evens, MD, MPH [facility] at [facility] [phone] 12/16/2020 12:59 PM

## 2020-12-16 ENCOUNTER — Inpatient Hospital Stay: Payer: Medicare Other | Admitting: Oncology

## 2020-12-16 ENCOUNTER — Other Ambulatory Visit: Payer: PPO

## 2020-12-16 VITALS — BP 158/78 | HR 63 | Temp 98.1°F | Resp 20 | Wt 208.4 lb

## 2020-12-16 DIAGNOSIS — D473 Essential (hemorrhagic) thrombocythemia: Secondary | ICD-10-CM | POA: Diagnosis not present

## 2020-12-16 DIAGNOSIS — Z8719 Personal history of other diseases of the digestive system: Secondary | ICD-10-CM | POA: Diagnosis not present

## 2020-12-16 DIAGNOSIS — Z79899 Other long term (current) drug therapy: Secondary | ICD-10-CM

## 2020-12-16 DIAGNOSIS — G8929 Other chronic pain: Secondary | ICD-10-CM | POA: Diagnosis not present

## 2020-12-16 DIAGNOSIS — Z87891 Personal history of nicotine dependence: Secondary | ICD-10-CM | POA: Diagnosis not present

## 2020-12-16 DIAGNOSIS — D7589 Other specified diseases of blood and blood-forming organs: Secondary | ICD-10-CM | POA: Diagnosis not present

## 2020-12-16 DIAGNOSIS — R5382 Chronic fatigue, unspecified: Secondary | ICD-10-CM | POA: Diagnosis not present

## 2020-12-16 DIAGNOSIS — Z7982 Long term (current) use of aspirin: Secondary | ICD-10-CM | POA: Diagnosis not present

## 2020-12-16 DIAGNOSIS — I10 Essential (primary) hypertension: Secondary | ICD-10-CM | POA: Diagnosis not present

## 2021-03-12 DIAGNOSIS — H524 Presbyopia: Secondary | ICD-10-CM | POA: Diagnosis not present

## 2021-03-17 ENCOUNTER — Inpatient Hospital Stay: Payer: Medicare Other | Attending: Oncology

## 2021-03-17 DIAGNOSIS — D75838 Other thrombocytosis: Secondary | ICD-10-CM | POA: Diagnosis not present

## 2021-03-17 DIAGNOSIS — D473 Essential (hemorrhagic) thrombocythemia: Secondary | ICD-10-CM

## 2021-03-17 DIAGNOSIS — Z79899 Other long term (current) drug therapy: Secondary | ICD-10-CM | POA: Diagnosis not present

## 2021-03-17 LAB — CBC WITH DIFFERENTIAL/PLATELET
Abs Immature Granulocytes: 0.01 10*3/uL (ref 0.00–0.07)
Basophils Absolute: 0 10*3/uL (ref 0.0–0.1)
Basophils Relative: 1 %
Eosinophils Absolute: 0 10*3/uL (ref 0.0–0.5)
Eosinophils Relative: 1 %
HCT: 38.6 % — ABNORMAL LOW (ref 39.0–52.0)
Hemoglobin: 13.2 g/dL (ref 13.0–17.0)
Immature Granulocytes: 0 %
Lymphocytes Relative: 30 %
Lymphs Abs: 1.3 10*3/uL (ref 0.7–4.0)
MCH: 38.9 pg — ABNORMAL HIGH (ref 26.0–34.0)
MCHC: 34.2 g/dL (ref 30.0–36.0)
MCV: 113.9 fL — ABNORMAL HIGH (ref 80.0–100.0)
Monocytes Absolute: 0.4 10*3/uL (ref 0.1–1.0)
Monocytes Relative: 10 %
Neutro Abs: 2.5 10*3/uL (ref 1.7–7.7)
Neutrophils Relative %: 58 %
Platelets: 267 10*3/uL (ref 150–400)
RBC: 3.39 MIL/uL — ABNORMAL LOW (ref 4.22–5.81)
RDW: 12.4 % (ref 11.5–15.5)
WBC: 4.3 10*3/uL (ref 4.0–10.5)
nRBC: 0 % (ref 0.0–0.2)

## 2021-03-17 LAB — COMPREHENSIVE METABOLIC PANEL
ALT: 18 U/L (ref 0–44)
AST: 27 U/L (ref 15–41)
Albumin: 4.4 g/dL (ref 3.5–5.0)
Alkaline Phosphatase: 54 U/L (ref 38–126)
Anion gap: 7 (ref 5–15)
BUN: 19 mg/dL (ref 8–23)
CO2: 26 mmol/L (ref 22–32)
Calcium: 8.7 mg/dL — ABNORMAL LOW (ref 8.9–10.3)
Chloride: 105 mmol/L (ref 98–111)
Creatinine, Ser: 0.92 mg/dL (ref 0.61–1.24)
GFR, Estimated: >60 mL/min (ref >60)
Glucose, Bld: 96 mg/dL (ref 70–99)
Potassium: 4.6 mmol/L (ref 3.5–5.1)
Sodium: 138 mmol/L (ref 135–145)
Total Bilirubin: 0.8 mg/dL (ref 0.3–1.2)
Total Protein: 7 g/dL (ref 6.5–8.1)

## 2021-05-06 ENCOUNTER — Other Ambulatory Visit: Payer: Self-pay | Admitting: Oncology

## 2021-05-23 DIAGNOSIS — L570 Actinic keratosis: Secondary | ICD-10-CM | POA: Diagnosis not present

## 2021-05-23 DIAGNOSIS — L821 Other seborrheic keratosis: Secondary | ICD-10-CM | POA: Diagnosis not present

## 2021-05-23 DIAGNOSIS — D171 Benign lipomatous neoplasm of skin and subcutaneous tissue of trunk: Secondary | ICD-10-CM | POA: Diagnosis not present

## 2021-05-23 DIAGNOSIS — X32XXXA Exposure to sunlight, initial encounter: Secondary | ICD-10-CM | POA: Diagnosis not present

## 2021-06-05 ENCOUNTER — Other Ambulatory Visit: Payer: Self-pay | Admitting: *Deleted

## 2021-06-05 MED ORDER — HYDROXYUREA 500 MG PO CAPS: ORAL_CAPSULE | ORAL | 0 refills | Status: DC

## 2021-06-05 NOTE — Telephone Encounter (Signed)
Patient/wife requesting that Hydrea be sent to mail order pharmacy and that a larger quantity be ordered

## 2021-06-16 ENCOUNTER — Other Ambulatory Visit: Payer: Self-pay

## 2021-06-16 ENCOUNTER — Other Ambulatory Visit: Payer: Medicare Other

## 2021-06-16 ENCOUNTER — Inpatient Hospital Stay: Payer: Medicare Other | Attending: Oncology

## 2021-06-16 DIAGNOSIS — D473 Essential (hemorrhagic) thrombocythemia: Secondary | ICD-10-CM | POA: Diagnosis not present

## 2021-06-16 DIAGNOSIS — Z79899 Other long term (current) drug therapy: Secondary | ICD-10-CM

## 2021-06-16 LAB — COMPREHENSIVE METABOLIC PANEL
ALT: 19 U/L (ref 0–44)
AST: 25 U/L (ref 15–41)
Albumin: 4.2 g/dL (ref 3.5–5.0)
Alkaline Phosphatase: 58 U/L (ref 38–126)
Anion gap: 9 (ref 5–15)
BUN: 17 mg/dL (ref 8–23)
CO2: 24 mmol/L (ref 22–32)
Calcium: 8.7 mg/dL — ABNORMAL LOW (ref 8.9–10.3)
Chloride: 103 mmol/L (ref 98–111)
Creatinine, Ser: 0.83 mg/dL (ref 0.61–1.24)
GFR, Estimated: >60 mL/min (ref >60)
Glucose, Bld: 113 mg/dL — ABNORMAL HIGH (ref 70–99)
Potassium: 3.9 mmol/L (ref 3.5–5.1)
Sodium: 136 mmol/L (ref 135–145)
Total Bilirubin: 0.9 mg/dL (ref 0.3–1.2)
Total Protein: 7.2 g/dL (ref 6.5–8.1)

## 2021-06-16 LAB — CBC WITH DIFFERENTIAL/PLATELET
Abs Immature Granulocytes: 0.01 10*3/uL (ref 0.00–0.07)
Basophils Absolute: 0 10*3/uL (ref 0.0–0.1)
Basophils Relative: 1 %
Eosinophils Absolute: 0.1 10*3/uL (ref 0.0–0.5)
Eosinophils Relative: 1 %
HCT: 38.5 % — ABNORMAL LOW (ref 39.0–52.0)
Hemoglobin: 13.1 g/dL (ref 13.0–17.0)
Immature Granulocytes: 0 %
Lymphocytes Relative: 29 %
Lymphs Abs: 1.4 10*3/uL (ref 0.7–4.0)
MCH: 39 pg — ABNORMAL HIGH (ref 26.0–34.0)
MCHC: 34 g/dL (ref 30.0–36.0)
MCV: 114.6 fL — ABNORMAL HIGH (ref 80.0–100.0)
Monocytes Absolute: 0.4 10*3/uL (ref 0.1–1.0)
Monocytes Relative: 9 %
Neutro Abs: 2.9 10*3/uL (ref 1.7–7.7)
Neutrophils Relative %: 60 %
Platelets: 251 10*3/uL (ref 150–400)
RBC: 3.36 MIL/uL — ABNORMAL LOW (ref 4.22–5.81)
RDW: 11.9 % (ref 11.5–15.5)
WBC: 4.8 10*3/uL (ref 4.0–10.5)
nRBC: 0 % (ref 0.0–0.2)

## 2021-06-17 ENCOUNTER — Inpatient Hospital Stay: Payer: Medicare Other | Admitting: Oncology

## 2021-06-18 ENCOUNTER — Telehealth: Payer: Self-pay | Admitting: Oncology

## 2021-06-18 NOTE — Telephone Encounter (Signed)
Left VM with patient to reschedule missed MyChart visit on 06/17/21.

## 2021-08-06 ENCOUNTER — Inpatient Hospital Stay: Payer: Medicare Other | Attending: Oncology | Admitting: Oncology

## 2021-08-06 ENCOUNTER — Other Ambulatory Visit: Payer: Self-pay

## 2021-08-06 ENCOUNTER — Encounter: Payer: Self-pay | Admitting: Oncology

## 2021-08-06 DIAGNOSIS — D473 Essential (hemorrhagic) thrombocythemia: Secondary | ICD-10-CM

## 2021-08-06 DIAGNOSIS — Z79899 Other long term (current) drug therapy: Secondary | ICD-10-CM

## 2021-08-07 NOTE — Progress Notes (Signed)
I connected with Leroy Morton on 08/07/21 at  2:30 PM EDT by video enabled telemedicine visit and verified that I am speaking with the correct person using two identifiers.   I discussed the limitations, risks, security and privacy concerns of performing an evaluation and management service by telemedicine and the availability of in-person appointments. I also discussed with the patient that there may be a patient responsible charge related to this service. The patient expressed understanding and agreed to proceed.  Other persons participating in the visit and their role in the encounter:  none  Patient's location:  work Provider's location:  work  Risk analyst Complaint:  routine f/u of high risk ET on hydrea  History of present illness: patient is a 70 year old gentleman with a past medical history significant for hypertension and GERD referred to [place] for thrombocytosis. Most recent CBC from 11/27/2019 showed platelet count of 651.  His prior platelet count from 05/30/2018 was 41.  White cell count and hemoglobin have remained normal.  Patient denies any changes in his appetite or unintentional weight loss or night sweats.  Denies any lumps or bumps anywhere.  Denies any prior thromboembolic episodes.    Results of blood work from 12/12/2019 were as follows, CBC showed white count of 6.7, H&H of 13.2/41 with a platelet count of 600.  JAK2 mutation testing was positive.  CAL R and MPL mutation testing negative.  BCR abl fish negative.  ESR normal.  Iron studies normal.   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  Patient was started on Hydrea 500 mg daily  Interval history tolerating hydrea well. Denies any complaints at this time.    Review of Systems  Constitutional:  Negative for chills, fever, malaise/fatigue and weight loss.  HENT:  Negative for  congestion, ear discharge and nosebleeds.   Eyes:  Negative for blurred vision.  Respiratory:  Negative for cough, hemoptysis, sputum production, shortness of breath and wheezing.   Cardiovascular:  Negative for chest pain, palpitations, orthopnea and claudication.  Gastrointestinal:  Negative for abdominal pain, blood in stool, constipation, diarrhea, heartburn, melena, nausea and vomiting.  Genitourinary:  Negative for dysuria, flank pain, frequency, hematuria and urgency.  Musculoskeletal:  Negative for back pain, joint pain and myalgias.  Skin:  Negative for rash.  Neurological:  Negative for dizziness, tingling, focal weakness, seizures, weakness and headaches.  Endo/Heme/Allergies:  Does not bruise/bleed easily.  Psychiatric/Behavioral:  Negative for depression and suicidal ideas. The patient does not have insomnia.    Allergies  Allergen Reactions   Other Other (See Comments)    " Fresh " seafood... Nausea, dyspnea    Past Medical History:  Diagnosis Date   Anxiety    GERD (gastroesophageal reflux disease)    Hyperlipidemia    Personal history of colonic polyps     Past Surgical History:  Procedure Laterality Date   CATARACT EXTRACTION W/ INTRAOCULAR LENS  IMPLANT, BILATERAL  12/15-R  1/16-- L   CYST EXCISION  30 years ago   from back   VASECTOMY      Social History   Socioeconomic History   Marital status: Married    Spouse name: Not on file   Number of children: 4   Years of education: Not on file   Highest education level: Not on file  Occupational History   Occupation: Automotive engineer    Comment: [employer]formerly [employer])  Tobacco  Use   Smoking status: Former    Types: Cigarettes    Quit date: 12/07/2005    Years since quitting: 15.6   Smokeless tobacco: Never  Vaping Use   Vaping Use: Never used  Substance and Sexual Activity   Alcohol use: Yes    Alcohol/week: 3.0 standard drinks    Types: 3 Cans of beer per week   Drug use: No    Sexual activity: Yes  Other Topics Concern   Not on file  Social History Narrative   Has living will   Now with health care POA--- would want wife, then son Gwyndolyn Saxon   Would accept resuscitation   No tube feeds if cognitively unaware   Social Determinants of Health   Financial Resource Strain: Not on file  Food Insecurity: Not on file  Transportation Needs: Not on file  Physical Activity: Not on file  Stress: Not on file  Social Connections: Not on file  Intimate Partner Violence: Not on file    Family History  Problem Relation Age of Onset   Stroke Father    Breast cancer Sister        breast cancer   Arthritis Brother    CAD Neg Hx    Diabetes Neg Hx      Current Outpatient Medications:    aspirin 81 MG chewable tablet, Chew by mouth daily., Disp: , Rfl:    cholecalciferol (VITAMIN D3) 25 MCG (1000 UNIT) tablet, Take 1,000 Units by mouth daily., Disp: , Rfl:    Cyanocobalamin (B-12) 5000 MCG SUBL, Place under the tongue., Disp: , Rfl:    hydroxyurea (HYDREA) 500 MG capsule, TAKE 1 CAPSULE BY MOUTH TWICE DAILY (WITH FOOD TO MINIMIZE GI SIDE EFFECTS), Disp: 180 capsule, Rfl: 0   Omega-3 Fatty Acids (OMEGA 3 500 PO), Take by mouth., Disp: , Rfl:   No results found.  No images are attached to the encounter.   CMP Latest Ref Rng & Units 06/16/2021  Glucose 70 - 99 mg/dL 113(H)  BUN 8 - 23 mg/dL 17  Creatinine 0.61 - 1.24 mg/dL 0.83  Sodium 135 - 145 mmol/L 136  Potassium 3.5 - 5.1 mmol/L 3.9  Chloride 98 - 111 mmol/L 103  CO2 22 - 32 mmol/L 24  Calcium 8.9 - 10.3 mg/dL 8.7(L)  Total Protein 6.5 - 8.1 g/dL 7.2  Total Bilirubin 0.3 - 1.2 mg/dL 0.9  Alkaline Phos 38 - 126 U/L 58  AST 15 - 41 U/L 25  ALT 0 - 44 U/L 19   CBC Latest Ref Rng & Units 06/16/2021  WBC 4.0 - 10.5 K/uL 4.8  Hemoglobin 13.0 - 17.0 g/dL 13.1  Hematocrit 39.0 - 52.0 % 38.5(L)  Platelets 150 - 400 K/uL 251     Observation/objective:appears in no acute distress over video visit today.  Breathing is non labored  Assessment and plan:Patient is a 70 year old male with h/o JAK2 positive ET high risk on hydrea. This is a routine f/u visit  Patient is on 500 mg hydrea daily and tolerating it well without significant side effects. Macrocytosis secondary to hydrea. Platelets <400 at goal. WBC and hb normal.   I will see him in 6 months in epr[place] with labs  Follow-up instructions:as above  I discussed the assessment and treatment plan with the patient. The patient was provided an opportunity to ask questions and all were answered. The patient agreed with the plan and demonstrated an understanding of the instructions.   The patient was advised  to call back or seek an in-person evaluation if the symptoms worsen or if the condition fails to improve as anticipated.   Visit Diagnosis: 1. Essential thrombocytosis ([place])   2. High risk medication use     Dr. Randa Evens, MD, MPH [facility] Tel- [phone] 08/07/2021 12:57 PM

## 2021-09-05 ENCOUNTER — Other Ambulatory Visit: Payer: Self-pay | Admitting: *Deleted

## 2021-09-05 ENCOUNTER — Encounter: Payer: Self-pay | Admitting: Oncology

## 2021-09-05 MED ORDER — HYDROXYUREA 500 MG PO CAPS: ORAL_CAPSULE | ORAL | 1 refills | Status: DC

## 2021-10-05 ENCOUNTER — Encounter: Payer: Self-pay | Admitting: Oncology

## 2021-12-11 ENCOUNTER — Other Ambulatory Visit: Payer: Self-pay | Admitting: Oncology

## 2021-12-15 ENCOUNTER — Other Ambulatory Visit: Payer: Self-pay

## 2021-12-15 ENCOUNTER — Encounter: Payer: Self-pay | Admitting: Internal Medicine

## 2021-12-15 ENCOUNTER — Ambulatory Visit (INDEPENDENT_AMBULATORY_CARE_PROVIDER_SITE_OTHER): Payer: PPO | Admitting: Internal Medicine

## 2021-12-15 VITALS — BP 124/74 | HR 58 | Temp 97.6°F | Ht 67.5 in | Wt 214.0 lb

## 2021-12-15 DIAGNOSIS — Z125 Encounter for screening for malignant neoplasm of prostate: Secondary | ICD-10-CM

## 2021-12-15 DIAGNOSIS — D473 Essential (hemorrhagic) thrombocythemia: Secondary | ICD-10-CM

## 2021-12-15 DIAGNOSIS — Z7189 Other specified counseling: Secondary | ICD-10-CM | POA: Diagnosis not present

## 2021-12-15 DIAGNOSIS — Z0000 Encounter for general adult medical examination without abnormal findings: Secondary | ICD-10-CM | POA: Diagnosis not present

## 2021-12-15 DIAGNOSIS — Z23 Encounter for immunization: Secondary | ICD-10-CM

## 2021-12-15 DIAGNOSIS — K219 Gastro-esophageal reflux disease without esophagitis: Secondary | ICD-10-CM | POA: Diagnosis not present

## 2021-12-15 NOTE — Assessment & Plan Note (Signed)
See social history 

## 2021-12-15 NOTE — Assessment & Plan Note (Signed)
Quiet without Rx

## 2021-12-15 NOTE — Assessment & Plan Note (Signed)
Controlled on the hydrea Mild anemia as well

## 2021-12-15 NOTE — Progress Notes (Signed)
Hearing Screening - Comments:: Did not pass whisper test. Aware of hearing loss. Vision Screening - Comments:: March 2022

## 2021-12-15 NOTE — Assessment & Plan Note (Signed)
I have personally reviewed the Medicare Annual Wellness questionnaire and have noted 1. The patient's medical and social history 2. Their use of alcohol, tobacco or illicit drugs 3. Their current medications and supplements 4. The patient's functional ability including ADL's, fall risks, home safety risks and hearing or visual             impairment. 5. Diet and physical activities 6. Evidence for depression or mood disorders  The patients weight, height, BMI and visual acuity have been recorded in the chart I have made referrals, counseling and provided education to the patient based review of the above and I have provided the pt with a written personalized care plan for preventive services.  I have provided you with a copy of your personalized plan for preventive services. Please take the time to review along with your updated medication list.  Needs to work on lifestyle and lose some of the weight he gained Needs bivalent COVID vaccine soon Getting colonoscopy soon Flu vaccine today shingrix soon Will check one last PSA today

## 2021-12-15 NOTE — Progress Notes (Signed)
Subjective:    Patient ID: Leroy Givens Sr., male    DOB: 04-09-1951, 71 y.o.   MRN: [id number]  HPI Here for Medicare wellness visit and follow up of chronic health conditions Reviewed advanced directives Reviewed other doctors--Dr Rao---hematology,  dermatology, [facility] No hospitalizations or surgery in the past year Occasional beer No tobacco products Vision is fine Hearing is not so good---discussed considering audiology Still working full time Tries to exercise regularly --but "off my game" since Thanksgiving No falls No depression or anhedonia Independent with instrumental ADLs No sig memory issues  Still on hydrea for thrombocytosis No apparent side effects of concern---does note bowels will vary from constipation and diarrhea. Will eat grapes if gets bound up No meds for this  No recent heartburn No dysphagia  Sleeping well  Current Outpatient Medications on File Prior to Visit  Medication Sig Dispense Refill   aspirin 81 MG chewable tablet Chew by mouth daily.     cholecalciferol (VITAMIN D3) 25 MCG (1000 UNIT) tablet Take 1,000 Units by mouth daily.     hydroxyurea (HYDREA) 500 MG capsule TAKE ONE CAPSULE BY MOUTH TWICE DAILY (WITH FOOD TO MINIMIZE GI SIDE EFFECTS) 180 capsule 1   Omega-3 Fatty Acids (OMEGA 3 500 PO) Take by mouth.     vitamin B-12 (CYANOCOBALAMIN) 1000 MCG tablet Take 1,000 mcg by mouth daily.     No current facility-administered medications on file prior to visit.    Allergies  Allergen Reactions   Other Other (See Comments)    " Fresh " seafood... Nausea, dyspnea    Past Medical History:  Diagnosis Date   Anxiety    GERD (gastroesophageal reflux disease)    Hyperlipidemia    Personal history of colonic polyps     Past Surgical History:  Procedure Laterality Date   CATARACT EXTRACTION W/ INTRAOCULAR LENS  IMPLANT, BILATERAL  12/15-R  1/16-- L   CYST EXCISION  30 years ago   from back   VASECTOMY       Family History  Problem Relation Age of Onset   Stroke Father    Breast cancer Sister        breast cancer   Arthritis Brother    CAD Neg Hx    Diabetes Neg Hx     Social History   Socioeconomic History   Marital status: Married    Spouse name: Not on file   Number of children: 4   Years of education: Not on file   Highest education level: Not on file  Occupational History   Occupation: Automotive engineer    Comment: [employer] (formerly [employer])  Tobacco Use   Smoking status: Former    Types: Cigarettes    Quit date: 12/07/2005    Years since quitting: 16.0   Smokeless tobacco: Never  Vaping Use   Vaping Use: Never used  Substance and Sexual Activity   Alcohol use: Yes    Alcohol/week: 3.0 standard drinks    Types: 3 Cans of beer per week   Drug use: No   Sexual activity: Yes  Other Topics Concern   Not on file  Social History Narrative   Has living will   Now with health care POA--- would want wife, then son Gwyndolyn Saxon   Would accept resuscitation   No tube feeds if cognitively unaware   Social Determinants of Health   Financial Resource Strain: Not on file  Food Insecurity: Not on file  Transportation Needs:  Not on file  Physical Activity: Not on file  Stress: Not on file  Social Connections: Not on file  Intimate Partner Violence: Not on file   Review of Systems Did have recent sinus infection--- lingered. He tested for COVID--and was positive. Mild symptoms---just stayed out of work for 5 days Appetite is good Has gained a few more pounds Wears seat belt Losing some teeth--- may get extracted and get dentures Voids okay--good stream No sig back or joint pains No worrisome skin lesions    Objective:   Physical Exam Constitutional:      Appearance: Normal appearance.  HENT:     Mouth/Throat:     Comments: No lesions Eyes:     Conjunctiva/sclera: Conjunctivae normal.     Pupils: Pupils are equal, round, and reactive to light.   Cardiovascular:     Rate and Rhythm: Normal rate and regular rhythm.     Pulses: Normal pulses.     Heart sounds: No murmur heard.   No gallop.  Pulmonary:     Effort: Pulmonary effort is normal.     Breath sounds: Normal breath sounds. No wheezing or rales.  Abdominal:     Palpations: Abdomen is soft.     Tenderness: There is no abdominal tenderness.  Musculoskeletal:     Cervical back: Neck supple.     Right lower leg: No edema.     Left lower leg: No edema.  Lymphadenopathy:     Cervical: No cervical adenopathy.  Skin:    Findings: No lesion or rash.     Comments: Scattered benign lesions  Neurological:     Mental Status: He is alert and oriented to person, place, and time.     Comments: Mini-cog normal  Psychiatric:        Mood and Affect: Mood normal.        Behavior: Behavior normal.           Assessment & Plan:

## 2021-12-16 LAB — CBC WITH DIFFERENTIAL/PLATELET
Basophils Absolute: 0 10*3/uL (ref 0.0–0.1)
Basophils Relative: 0.8 % (ref 0.0–3.0)
Eosinophils Absolute: 0 10*3/uL (ref 0.0–0.7)
Eosinophils Relative: 1 % (ref 0.0–5.0)
HCT: 40.7 % (ref 39.0–52.0)
Hemoglobin: 13.3 g/dL (ref 13.0–17.0)
Lymphocytes Relative: 29.7 % (ref 12.0–46.0)
Lymphs Abs: 1.4 10*3/uL (ref 0.7–4.0)
MCHC: 32.8 g/dL (ref 30.0–36.0)
MCV: 116.2 fl — ABNORMAL HIGH (ref 78.0–100.0)
Monocytes Absolute: 0.4 10*3/uL (ref 0.1–1.0)
Monocytes Relative: 8.7 % (ref 3.0–12.0)
Neutro Abs: 2.9 10*3/uL (ref 1.4–7.7)
Neutrophils Relative %: 59.8 % (ref 43.0–77.0)
Platelets: 324 10*3/uL (ref 150.0–400.0)
RBC: 3.5 Mil/uL — ABNORMAL LOW (ref 4.22–5.81)
RDW: 12.6 % (ref 11.5–15.5)
WBC: 4.8 10*3/uL (ref 4.0–10.5)

## 2021-12-16 LAB — COMPREHENSIVE METABOLIC PANEL
ALT: 39 U/L (ref 0–53)
AST: 90 U/L — ABNORMAL HIGH (ref 0–37)
Albumin: 4.4 g/dL (ref 3.5–5.2)
Alkaline Phosphatase: 74 U/L (ref 39–117)
BUN: 19 mg/dL (ref 6–23)
CO2: 28 mEq/L (ref 19–32)
Calcium: 8.9 mg/dL (ref 8.4–10.5)
Chloride: 102 mEq/L (ref 96–112)
Creatinine, Ser: 0.9 mg/dL (ref 0.40–1.50)
GFR: 85.27 mL/min (ref >60.00)
Glucose, Bld: 93 mg/dL (ref 70–99)
Potassium: 3.8 mEq/L (ref 3.5–5.1)
Sodium: 137 mEq/L (ref 135–145)
Total Bilirubin: 0.8 mg/dL (ref 0.2–1.2)
Total Protein: 7.1 g/dL (ref 6.0–8.3)

## 2021-12-16 LAB — PSA, MEDICARE: PSA: 0.7 ng/ml (ref 0.10–4.00)

## 2021-12-30 DIAGNOSIS — E669 Obesity, unspecified: Secondary | ICD-10-CM | POA: Diagnosis not present

## 2021-12-30 DIAGNOSIS — D471 Chronic myeloproliferative disease: Secondary | ICD-10-CM | POA: Diagnosis not present

## 2021-12-30 DIAGNOSIS — Z9849 Cataract extraction status, unspecified eye: Secondary | ICD-10-CM | POA: Diagnosis not present

## 2021-12-30 DIAGNOSIS — Z87891 Personal history of nicotine dependence: Secondary | ICD-10-CM | POA: Diagnosis not present

## 2021-12-30 DIAGNOSIS — Z7982 Long term (current) use of aspirin: Secondary | ICD-10-CM | POA: Diagnosis not present

## 2022-01-16 DIAGNOSIS — Z86010 Personal history of colonic polyps: Secondary | ICD-10-CM | POA: Diagnosis not present

## 2022-01-16 DIAGNOSIS — Z1211 Encounter for screening for malignant neoplasm of colon: Secondary | ICD-10-CM | POA: Diagnosis not present

## 2022-01-16 DIAGNOSIS — K5730 Diverticulosis of large intestine without perforation or abscess without bleeding: Secondary | ICD-10-CM | POA: Diagnosis not present

## 2022-01-16 DIAGNOSIS — D122 Benign neoplasm of ascending colon: Secondary | ICD-10-CM | POA: Diagnosis not present

## 2022-01-16 DIAGNOSIS — K635 Polyp of colon: Secondary | ICD-10-CM | POA: Diagnosis not present

## 2022-01-16 DIAGNOSIS — D125 Benign neoplasm of sigmoid colon: Secondary | ICD-10-CM | POA: Diagnosis not present

## 2022-01-16 LAB — HM COLONOSCOPY

## 2022-02-02 ENCOUNTER — Ambulatory Visit: Payer: Medicare Other | Admitting: Oncology

## 2022-02-02 ENCOUNTER — Other Ambulatory Visit: Payer: Medicare Other

## 2022-02-02 ENCOUNTER — Other Ambulatory Visit: Payer: Self-pay

## 2022-02-02 ENCOUNTER — Inpatient Hospital Stay: Payer: PPO | Attending: Oncology

## 2022-02-02 DIAGNOSIS — D473 Essential (hemorrhagic) thrombocythemia: Secondary | ICD-10-CM | POA: Diagnosis not present

## 2022-02-02 DIAGNOSIS — Z8719 Personal history of other diseases of the digestive system: Secondary | ICD-10-CM | POA: Insufficient documentation

## 2022-02-02 DIAGNOSIS — Z87891 Personal history of nicotine dependence: Secondary | ICD-10-CM | POA: Insufficient documentation

## 2022-02-02 DIAGNOSIS — D7589 Other specified diseases of blood and blood-forming organs: Secondary | ICD-10-CM | POA: Diagnosis not present

## 2022-02-02 DIAGNOSIS — D75839 Thrombocytosis, unspecified: Secondary | ICD-10-CM | POA: Insufficient documentation

## 2022-02-02 DIAGNOSIS — Z79899 Other long term (current) drug therapy: Secondary | ICD-10-CM | POA: Insufficient documentation

## 2022-02-02 LAB — CBC WITH DIFFERENTIAL/PLATELET
Abs Immature Granulocytes: 0.01 10*3/uL (ref 0.00–0.07)
Basophils Absolute: 0 10*3/uL (ref 0.0–0.1)
Basophils Relative: 1 %
Eosinophils Absolute: 0.1 10*3/uL (ref 0.0–0.5)
Eosinophils Relative: 1 %
HCT: 39.1 % (ref 39.0–52.0)
Hemoglobin: 13.1 g/dL (ref 13.0–17.0)
Immature Granulocytes: 0 %
Lymphocytes Relative: 32 %
Lymphs Abs: 1.3 10*3/uL (ref 0.7–4.0)
MCH: 38.8 pg — ABNORMAL HIGH (ref 26.0–34.0)
MCHC: 33.5 g/dL (ref 30.0–36.0)
MCV: 115.7 fL — ABNORMAL HIGH (ref 80.0–100.0)
Monocytes Absolute: 0.4 10*3/uL (ref 0.1–1.0)
Monocytes Relative: 10 %
Neutro Abs: 2.2 10*3/uL (ref 1.7–7.7)
Neutrophils Relative %: 56 %
Platelets: 275 10*3/uL (ref 150–400)
RBC: 3.38 MIL/uL — ABNORMAL LOW (ref 4.22–5.81)
RDW: 12.1 % (ref 11.5–15.5)
WBC: 4 10*3/uL (ref 4.0–10.5)
nRBC: 0 % (ref 0.0–0.2)

## 2022-02-02 LAB — COMPREHENSIVE METABOLIC PANEL
ALT: 18 U/L (ref 0–44)
AST: 25 U/L (ref 15–41)
Albumin: 4.2 g/dL (ref 3.5–5.0)
Alkaline Phosphatase: 53 U/L (ref 38–126)
Anion gap: 6 (ref 5–15)
BUN: 21 mg/dL (ref 8–23)
CO2: 27 mmol/L (ref 22–32)
Calcium: 8.5 mg/dL — ABNORMAL LOW (ref 8.9–10.3)
Chloride: 104 mmol/L (ref 98–111)
Creatinine, Ser: 1 mg/dL (ref 0.61–1.24)
GFR, Estimated: >60 mL/min (ref >60)
Glucose, Bld: 109 mg/dL — ABNORMAL HIGH (ref 70–99)
Potassium: 3.6 mmol/L (ref 3.5–5.1)
Sodium: 137 mmol/L (ref 135–145)
Total Bilirubin: 0.2 mg/dL — ABNORMAL LOW (ref 0.3–1.2)
Total Protein: 7 g/dL (ref 6.5–8.1)

## 2022-02-03 ENCOUNTER — Encounter: Payer: Self-pay | Admitting: Oncology

## 2022-02-03 ENCOUNTER — Inpatient Hospital Stay: Payer: PPO | Admitting: Oncology

## 2022-02-03 VITALS — BP 152/59 | HR 56 | Temp 99.2°F | Resp 16 | Ht 67.5 in | Wt 212.0 lb

## 2022-02-03 DIAGNOSIS — D473 Essential (hemorrhagic) thrombocythemia: Secondary | ICD-10-CM

## 2022-02-03 DIAGNOSIS — Z79899 Other long term (current) drug therapy: Secondary | ICD-10-CM

## 2022-02-05 NOTE — Progress Notes (Signed)
Hematology/Oncology Consult note [facility]  [phone] [phone]  Patient Care Team: Venia Carbon, MD as PCP - General (Internal Medicine) Christene Lye, MD (General Surgery) Bryson Ha, OD as Referring Physician (Optometry)   Name of the patient: Leroy Morton  [id number]  May 12, 1951   Date of visit: 02/05/22  Diagnosis-high risk essential thrombocytosis on Hydrea  Chief complaint/ Reason for visit-routine follow-up of ET on Hydrea  Heme/Onc history:  patient is a 71 year old gentleman with a past medical history significant for hypertension and GERD referred to [place] for thrombocytosis. Most recent CBC from 11/27/2019 showed platelet count of 651.  His prior platelet count from 05/30/2018 was 41.  White cell count and hemoglobin have remained normal.  Patient denies any changes in his appetite or unintentional weight loss or night sweats.  Denies any lumps or bumps anywhere.  Denies any prior thromboembolic episodes.    Results of blood work from 12/12/2019 were as follows, CBC showed white count of 6.7, H&H of 13.2/41 with a platelet count of 600.  JAK2 mutation testing was positive.  CAL R and MPL mutation testing negative.  BCR abl fish negative.  ESR normal.  Iron studies normal.   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  Patient was started on Hydrea 500 mg daily  Interval history-patient is doing well overall and tolerating hydroxyurea well without any significant side effects.  Denies any fatigue, mouth sores, leg ulcers or dizziness.  Denies any nail changes  ECOG PS- 0 Pain scale- 0   Review of systems- Review of Systems  Constitutional:  Negative for chills, fever, malaise/fatigue and weight loss.  HENT:  Negative for congestion, ear discharge and nosebleeds.    Eyes:  Negative for blurred vision.  Respiratory:  Negative for cough, hemoptysis, sputum production, shortness of breath and wheezing.   Cardiovascular:  Negative for chest pain, palpitations, orthopnea and claudication.  Gastrointestinal:  Negative for abdominal pain, blood in stool, constipation, diarrhea, heartburn, melena, nausea and vomiting.  Genitourinary:  Negative for dysuria, flank pain, frequency, hematuria and urgency.  Musculoskeletal:  Negative for back pain, joint pain and myalgias.  Skin:  Negative for rash.  Neurological:  Negative for dizziness, tingling, focal weakness, seizures, weakness and headaches.  Endo/Heme/Allergies:  Does not bruise/bleed easily.  Psychiatric/Behavioral:  Negative for depression and suicidal ideas. The patient does not have insomnia.      Allergies  Allergen Reactions   Other Other (See Comments)    " Fresh " seafood... Nausea, dyspnea     Past Medical History:  Diagnosis Date   Anxiety    GERD (gastroesophageal reflux disease)    Hyperlipidemia    Personal history of colonic polyps      Past Surgical History:  Procedure Laterality Date   CATARACT EXTRACTION W/ INTRAOCULAR LENS  IMPLANT, BILATERAL  12/15-R  1/16-- L   CYST EXCISION  30 years ago   from back   VASECTOMY      Social History   Socioeconomic History   Marital status: Married    Spouse name: Not on file   Number of children: 4   Years of education: Not on file   Highest education level: Not on file  Occupational History   Occupation: Automotive engineer    Comment: [employer] (formerly [employer])  Tobacco Use   Smoking status: Former  Types: Cigarettes    Quit date: 12/07/2005    Years since quitting: 16.1   Smokeless tobacco: Never  Vaping Use   Vaping Use: Never used  Substance and Sexual Activity   Alcohol use: Yes    Alcohol/week: 3.0 standard drinks    Types: 3 Cans of beer per week   Drug use: No   Sexual activity: Yes  Other Topics  Concern   Not on file  Social History Narrative   Has living will   Now with health care POA--- would want wife, then son Gwyndolyn Saxon   Would accept resuscitation   No tube feeds if cognitively unaware   Social Determinants of Health   Financial Resource Strain: Not on file  Food Insecurity: Not on file  Transportation Needs: Not on file  Physical Activity: Not on file  Stress: Not on file  Social Connections: Not on file  Intimate Partner Violence: Not on file    Family History  Problem Relation Age of Onset   Stroke Father    Breast cancer Sister        breast cancer   Arthritis Brother    CAD Neg Hx    Diabetes Neg Hx      Current Outpatient Medications:    aspirin 81 MG chewable tablet, Chew by mouth daily., Disp: , Rfl:    cholecalciferol (VITAMIN D3) 25 MCG (1000 UNIT) tablet, Take 1,000 Units by mouth daily., Disp: , Rfl:    hydroxyurea (HYDREA) 500 MG capsule, TAKE ONE CAPSULE BY MOUTH TWICE DAILY (WITH FOOD TO MINIMIZE GI SIDE EFFECTS), Disp: 180 capsule, Rfl: 1   Omega-3 Fatty Acids (OMEGA 3 500 PO), Take by mouth., Disp: , Rfl:    vitamin B-12 (CYANOCOBALAMIN) 1000 MCG tablet, Take 1,000 mcg by mouth daily., Disp: , Rfl:   Physical exam:  Vitals:   02/03/22 1436  BP: (!) 152/59  Pulse: (!) 56  Resp: 16  Temp: 99.2 F (37.3 C)  TempSrc: Tympanic  SpO2: 97%  Weight: 212 lb (96.2 kg)  Height: 5' 7.5" (1.715 m)   Physical Exam Constitutional:      General: He is not in acute distress. Cardiovascular:     Rate and Rhythm: Normal rate and regular rhythm.     Heart sounds: Normal heart sounds.  Pulmonary:     Effort: Pulmonary effort is normal.     Breath sounds: Normal breath sounds.  Abdominal:     General: Bowel sounds are normal.     Palpations: Abdomen is soft.  Skin:    General: Skin is warm and dry.  Neurological:     Mental Status: He is alert and oriented to person, place, and time.     CMP Latest Ref Rng & Units 02/02/2022  Glucose 70 - 99  mg/dL 109(H)  BUN 8 - 23 mg/dL 21  Creatinine 0.61 - 1.24 mg/dL 1.00  Sodium 135 - 145 mmol/L 137  Potassium 3.5 - 5.1 mmol/L 3.6  Chloride 98 - 111 mmol/L 104  CO2 22 - 32 mmol/L 27  Calcium 8.9 - 10.3 mg/dL 8.5(L)  Total Protein 6.5 - 8.1 g/dL 7.0  Total Bilirubin 0.3 - 1.2 mg/dL 0.2(L)  Alkaline Phos 38 - 126 U/L 53  AST 15 - 41 U/L 25  ALT 0 - 44 U/L 18   CBC Latest Ref Rng & Units 02/02/2022  WBC 4.0 - 10.5 K/uL 4.0  Hemoglobin 13.0 - 17.0 g/dL 13.1  Hematocrit 39.0 - 52.0 % 39.1  Platelets  150 - 400 K/uL 275     Assessment and plan- Patient is a 71 y.o. male with high risk essential thrombocytosis on Hydrea here for routine follow-up     Patient is tolerating Hydrea well without any significant side effects.  Hemoglobin remains normal at 13.1 with a white count of 4 and a platelet count of 275 which is at goal.  Macrocytosis secondary to Hydrea.  CMP is within normal limits.  Given the stability of his counts I will plan to see him in 6 months with CBC with differential and CMP Visit Diagnosis 1. Essential thrombocytosis ([place])   2. High risk medication use      Dr. Randa Evens, MD, MPH [facility] [phone] 02/05/2022 9:55 AM

## 2022-03-24 DIAGNOSIS — H524 Presbyopia: Secondary | ICD-10-CM | POA: Diagnosis not present

## 2022-06-01 DIAGNOSIS — X32XXXA Exposure to sunlight, initial encounter: Secondary | ICD-10-CM | POA: Diagnosis not present

## 2022-06-01 DIAGNOSIS — D2261 Melanocytic nevi of right upper limb, including shoulder: Secondary | ICD-10-CM | POA: Diagnosis not present

## 2022-06-01 DIAGNOSIS — L570 Actinic keratosis: Secondary | ICD-10-CM | POA: Diagnosis not present

## 2022-06-01 DIAGNOSIS — L821 Other seborrheic keratosis: Secondary | ICD-10-CM | POA: Diagnosis not present

## 2022-06-01 DIAGNOSIS — D0462 Carcinoma in situ of skin of left upper limb, including shoulder: Secondary | ICD-10-CM | POA: Diagnosis not present

## 2022-06-01 DIAGNOSIS — D225 Melanocytic nevi of trunk: Secondary | ICD-10-CM | POA: Diagnosis not present

## 2022-06-01 DIAGNOSIS — D2262 Melanocytic nevi of left upper limb, including shoulder: Secondary | ICD-10-CM | POA: Diagnosis not present

## 2022-06-01 DIAGNOSIS — D2271 Melanocytic nevi of right lower limb, including hip: Secondary | ICD-10-CM | POA: Diagnosis not present

## 2022-06-01 DIAGNOSIS — D2272 Melanocytic nevi of left lower limb, including hip: Secondary | ICD-10-CM | POA: Diagnosis not present

## 2022-06-01 DIAGNOSIS — D485 Neoplasm of uncertain behavior of skin: Secondary | ICD-10-CM | POA: Diagnosis not present

## 2022-07-06 DIAGNOSIS — L905 Scar conditions and fibrosis of skin: Secondary | ICD-10-CM | POA: Diagnosis not present

## 2022-07-06 DIAGNOSIS — D0462 Carcinoma in situ of skin of left upper limb, including shoulder: Secondary | ICD-10-CM | POA: Diagnosis not present

## 2022-07-31 ENCOUNTER — Inpatient Hospital Stay: Payer: PPO | Attending: Oncology

## 2022-07-31 DIAGNOSIS — D75839 Thrombocytosis, unspecified: Secondary | ICD-10-CM | POA: Diagnosis not present

## 2022-07-31 DIAGNOSIS — D473 Essential (hemorrhagic) thrombocythemia: Secondary | ICD-10-CM

## 2022-07-31 LAB — CBC WITH DIFFERENTIAL/PLATELET
Abs Immature Granulocytes: 0.02 10*3/uL (ref 0.00–0.07)
Basophils Absolute: 0 10*3/uL (ref 0.0–0.1)
Basophils Relative: 1 %
Eosinophils Absolute: 0 10*3/uL (ref 0.0–0.5)
Eosinophils Relative: 0 %
HCT: 38.3 % — ABNORMAL LOW (ref 39.0–52.0)
Hemoglobin: 13.1 g/dL (ref 13.0–17.0)
Immature Granulocytes: 0 %
Lymphocytes Relative: 25 %
Lymphs Abs: 1.4 10*3/uL (ref 0.7–4.0)
MCH: 39.3 pg — ABNORMAL HIGH (ref 26.0–34.0)
MCHC: 34.2 g/dL (ref 30.0–36.0)
MCV: 115 fL — ABNORMAL HIGH (ref 80.0–100.0)
Monocytes Absolute: 0.4 10*3/uL (ref 0.1–1.0)
Monocytes Relative: 7 %
Neutro Abs: 3.8 10*3/uL (ref 1.7–7.7)
Neutrophils Relative %: 67 %
Platelets: 295 10*3/uL (ref 150–400)
RBC: 3.33 MIL/uL — ABNORMAL LOW (ref 4.22–5.81)
RDW: 11.9 % (ref 11.5–15.5)
WBC: 5.7 10*3/uL (ref 4.0–10.5)
nRBC: 0 % (ref 0.0–0.2)

## 2022-07-31 LAB — COMPREHENSIVE METABOLIC PANEL
ALT: 20 U/L (ref 0–44)
AST: 27 U/L (ref 15–41)
Albumin: 4.2 g/dL (ref 3.5–5.0)
Alkaline Phosphatase: 55 U/L (ref 38–126)
Anion gap: 7 (ref 5–15)
BUN: 24 mg/dL — ABNORMAL HIGH (ref 8–23)
CO2: 25 mmol/L (ref 22–32)
Calcium: 8.8 mg/dL — ABNORMAL LOW (ref 8.9–10.3)
Chloride: 107 mmol/L (ref 98–111)
Creatinine, Ser: 1.08 mg/dL (ref 0.61–1.24)
GFR, Estimated: >60 mL/min (ref >60)
Glucose, Bld: 105 mg/dL — ABNORMAL HIGH (ref 70–99)
Potassium: 3.8 mmol/L (ref 3.5–5.1)
Sodium: 139 mmol/L (ref 135–145)
Total Bilirubin: 1.1 mg/dL (ref 0.3–1.2)
Total Protein: 7.3 g/dL (ref 6.5–8.1)

## 2022-08-03 ENCOUNTER — Other Ambulatory Visit: Payer: PPO

## 2022-08-04 ENCOUNTER — Inpatient Hospital Stay: Payer: PPO | Admitting: Oncology

## 2022-08-04 ENCOUNTER — Encounter: Payer: Self-pay | Admitting: Oncology

## 2022-08-04 DIAGNOSIS — D473 Essential (hemorrhagic) thrombocythemia: Secondary | ICD-10-CM | POA: Diagnosis not present

## 2022-08-04 DIAGNOSIS — Z79899 Other long term (current) drug therapy: Secondary | ICD-10-CM | POA: Diagnosis not present

## 2022-08-04 NOTE — Progress Notes (Unsigned)
Patient scheduled for video visit today for follow up regarding essential thrombocytosis, patient confirmed with 2 identifiers. Patient continues to take Hydrea 500 mg capsules twice a day. Patient denies any further concerns.

## 2022-08-06 NOTE — Progress Notes (Signed)
I connected with Leroy Morton on 08/06/22 at  3:15 PM EDT by video enabled telemedicine visit and verified that I am speaking with the correct person using two identifiers.   I discussed the limitations, risks, security and privacy concerns of performing an evaluation and management service by telemedicine and the availability of in-person appointments. I also discussed with the patient that there may be a patient responsible charge related to this service. The patient expressed understanding and agreed to proceed.  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 Most recent CBC from 11/27/2019 showed platelet count of 651.  His prior platelet count from 05/30/2018 was 41.  White cell count and hemoglobin have remained normal.  Patient denies any changes in his appetite or unintentional weight loss or night sweats.  Denies any lumps or bumps anywhere.  Denies any prior thromboembolic episodes.    Results of blood work from 12/12/2019 were as follows, CBC showed white count of 6.7, H&H of 13.2/41 with a platelet count of 600.  JAK2 mutation testing was positive.  CAL R and MPL mutation testing negative.  BCR abl fish negative.  ESR normal.  Iron studies normal.   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  Patient was started on Hydrea 500 mg daily.  He was subsequently increased to 500 mg twice a day    Interval history patient reports decrease in his exercise tolerance over the last several months.  He is still active and  independent of his ADLs and IADLs but does get tired more easily.   Review of Systems  Constitutional:  Positive for malaise/fatigue. Negative for chills, fever and weight loss.  HENT:  Negative for congestion, ear discharge and nosebleeds.   Eyes:  Negative for blurred vision.  Respiratory:  Negative for cough, hemoptysis, sputum production, shortness of breath and wheezing.   Cardiovascular:  Negative for chest pain, palpitations, orthopnea and claudication.  Gastrointestinal:  Negative for abdominal pain, blood in stool, constipation, diarrhea, heartburn, melena, nausea and vomiting.  Genitourinary:  Negative for dysuria, flank pain, frequency, hematuria and urgency.  Musculoskeletal:  Negative for back pain, joint pain and myalgias.  Skin:  Negative for rash.  Neurological:  Negative for dizziness, tingling, focal weakness, seizures, weakness and headaches.  Endo/Heme/Allergies:  Does not bruise/bleed easily.  Psychiatric/Behavioral:  Negative for depression and suicidal ideas. The patient does not have insomnia.     Allergies  Allergen Reactions   Other Other (See Comments) and Shortness Of Breath    " Fresh " seafood... Nausea, dyspnea "Seafood but not shellfish"    Past Medical History:  Diagnosis Date   Anxiety    GERD (gastroesophageal reflux disease)    Hyperlipidemia    Personal history of colonic polyps     Past Surgical History:  Procedure Laterality Date   CATARACT EXTRACTION W/ INTRAOCULAR LENS  IMPLANT, BILATERAL  12/15-R  1/16-- L   CYST EXCISION  30 years ago   from back   SKIN CANCER EXCISION Left    Squamous cell skin cancer removal from left wrist   VASECTOMY  Social History   Socioeconomic History   Marital status: Married    Spouse name: Not on file   Number of children: 4   Years of education: Not on file   Highest education level: Not on file  Occupational History   Occupation: Automotive engineer    Comment: [employer]formerly  [employer])  Tobacco Use   Smoking status: Former    Types: Cigarettes    Quit date: 12/07/2005    Years since quitting: 16.6   Smokeless tobacco: Never  Vaping Use   Vaping Use: Never used  Substance and Sexual Activity   Alcohol use: Yes    Alcohol/week: 3.0 standard drinks of alcohol    Types: 3 Cans of beer per week   Drug use: No   Sexual activity: Yes  Other Topics Concern   Not on file  Social History Narrative   Has living will   Now with health care POA--- would want wife, then son Gwyndolyn Saxon   Would accept resuscitation   No tube feeds if cognitively unaware   Social Determinants of Health   Financial Resource Strain: Not on file  Food Insecurity: Not on file  Transportation Needs: Not on file  Physical Activity: Not on file  Stress: Not on file  Social Connections: Not on file  Intimate Partner Violence: Not on file    Family History  Problem Relation Age of Onset   Stroke Father    Breast cancer Sister        breast cancer   Arthritis Brother    CAD Neg Hx    Diabetes Neg Hx      Current Outpatient Medications:    aspirin 81 MG chewable tablet, Chew by mouth daily., Disp: , Rfl:    cholecalciferol (VITAMIN D3) 25 MCG (1000 UNIT) tablet, Take 1,000 Units by mouth daily., Disp: , Rfl:    hydroxyurea (HYDREA) 500 MG capsule, TAKE ONE CAPSULE BY MOUTH TWICE DAILY (WITH FOOD TO MINIMIZE GI SIDE EFFECTS), Disp: 180 capsule, Rfl: 1   Omega-3 Fatty Acids (OMEGA 3 500 PO), Take by mouth., Disp: , Rfl:    vitamin B-12 (CYANOCOBALAMIN) 1000 MCG tablet, Take 1,000 mcg by mouth daily., Disp: , Rfl:   No results found.  No images are attached to the encounter.      Latest Ref Rng & Units 07/31/2022    1:49 PM  CMP  Glucose 70 - 99 mg/dL 105   BUN 8 - 23 mg/dL 24   Creatinine 0.61 - 1.24 mg/dL 1.08   Sodium 135 - 145 mmol/L 139   Potassium 3.5 - 5.1 mmol/L 3.8   Chloride 98 - 111 mmol/L 107   CO2 22 - 32 mmol/L 25   Calcium 8.9 - 10.3 mg/dL 8.8   Total  Protein 6.5 - 8.1 g/dL 7.3   Total Bilirubin 0.3 - 1.2 mg/dL 1.1   Alkaline Phos 38 - 126 U/L 55   AST 15 - 41 U/L 27   ALT 0 - 44 U/L 20       Latest Ref Rng & Units 07/31/2022    1:49 PM  CBC  WBC 4.0 - 10.5 K/uL 5.7   Hemoglobin 13.0 - 17.0 g/dL 13.1   Hematocrit 39.0 - 52.0 % 38.3   Platelets 150 - 400 K/uL 295      Observation/objective: Appears in no acute distress over video visit today.  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  His platelet counts are at goal less than 400 and hemoglobin 13.1 his baseline hemoglobin runs between 13-14.  However patient does report decline in his exercise tolerance with this dose of Hydrea.  I have therefore asked him to alternate 500 mg along with 1000 mg and we will see if he can keep his platelets less than 400 and if he has more energy at a lower dose.  Patient is agreeable with this plan.  We will repeat CBC with differential in 6 weeks and 12 weeks and I will see him back in 6 months  Follow-up instructions: As above  I discussed the assessment and treatment plan with the patient. The patient was provided an opportunity to ask questions and all were answered. The patient agreed with the plan and demonstrated an understanding of the instructions.   The patient was advised to call back or seek an in-person evaluation if the symptoms worsen or if the condition fails to improve as anticipated.  I provided 13 minutes of face-to-face video visit time during this encounter, and > 50% was spent counseling as documented under my assessment & plan.  Visit Diagnosis: 1. High risk medication use   2. Essential thrombocytosis (HCC)     Dr. Randa Evens, MD, MPH [facility] at [facility] Tel- [phone] 08/06/2022 10:44 AM

## 2022-08-18 ENCOUNTER — Telehealth: Payer: Self-pay | Admitting: Internal Medicine

## 2022-08-18 NOTE — Telephone Encounter (Signed)
Patient called in stating that he had his shingles shot done at [facility]. He had the first one done on 04/10/2022 and the second one was done on 07/14/2022. Thank you!

## 2022-08-18 NOTE — Telephone Encounter (Signed)
Chart has been updated.

## 2022-09-15 ENCOUNTER — Inpatient Hospital Stay: Payer: PPO | Attending: Oncology

## 2022-09-15 DIAGNOSIS — D75839 Thrombocytosis, unspecified: Secondary | ICD-10-CM | POA: Insufficient documentation

## 2022-09-15 DIAGNOSIS — D473 Essential (hemorrhagic) thrombocythemia: Secondary | ICD-10-CM | POA: Diagnosis not present

## 2022-09-15 LAB — CBC WITH DIFFERENTIAL/PLATELET
Abs Immature Granulocytes: 0.01 10*3/uL (ref 0.00–0.07)
Basophils Absolute: 0.1 10*3/uL (ref 0.0–0.1)
Basophils Relative: 1 %
Eosinophils Absolute: 0.1 10*3/uL (ref 0.0–0.5)
Eosinophils Relative: 1 %
HCT: 39.5 % (ref 39.0–52.0)
Hemoglobin: 13.4 g/dL (ref 13.0–17.0)
Immature Granulocytes: 0 %
Lymphocytes Relative: 24 %
Lymphs Abs: 1.5 10*3/uL (ref 0.7–4.0)
MCH: 38 pg — ABNORMAL HIGH (ref 26.0–34.0)
MCHC: 33.9 g/dL (ref 30.0–36.0)
MCV: 111.9 fL — ABNORMAL HIGH (ref 80.0–100.0)
Monocytes Absolute: 0.6 10*3/uL (ref 0.1–1.0)
Monocytes Relative: 10 %
Neutro Abs: 4.1 10*3/uL (ref 1.7–7.7)
Neutrophils Relative %: 64 %
Platelets: 276 10*3/uL (ref 150–400)
RBC: 3.53 MIL/uL — ABNORMAL LOW (ref 4.22–5.81)
RDW: 11.6 % (ref 11.5–15.5)
WBC: 6.4 10*3/uL (ref 4.0–10.5)
nRBC: 0 % (ref 0.0–0.2)

## 2022-09-28 ENCOUNTER — Other Ambulatory Visit: Payer: Self-pay | Admitting: Oncology

## 2022-10-27 ENCOUNTER — Other Ambulatory Visit: Payer: PPO

## 2022-10-28 ENCOUNTER — Inpatient Hospital Stay: Payer: PPO | Attending: Oncology

## 2022-10-28 DIAGNOSIS — D473 Essential (hemorrhagic) thrombocythemia: Secondary | ICD-10-CM | POA: Insufficient documentation

## 2022-10-28 DIAGNOSIS — D75839 Thrombocytosis, unspecified: Secondary | ICD-10-CM | POA: Diagnosis not present

## 2022-10-28 LAB — CBC WITH DIFFERENTIAL/PLATELET
Abs Immature Granulocytes: 0.01 10*3/uL (ref 0.00–0.07)
Basophils Absolute: 0.1 10*3/uL (ref 0.0–0.1)
Basophils Relative: 1 %
Eosinophils Absolute: 0.1 10*3/uL (ref 0.0–0.5)
Eosinophils Relative: 2 %
HCT: 39.1 % (ref 39.0–52.0)
Hemoglobin: 13.2 g/dL (ref 13.0–17.0)
Immature Granulocytes: 0 %
Lymphocytes Relative: 30 %
Lymphs Abs: 1.5 10*3/uL (ref 0.7–4.0)
MCH: 37.3 pg — ABNORMAL HIGH (ref 26.0–34.0)
MCHC: 33.8 g/dL (ref 30.0–36.0)
MCV: 110.5 fL — ABNORMAL HIGH (ref 80.0–100.0)
Monocytes Absolute: 0.5 10*3/uL (ref 0.1–1.0)
Monocytes Relative: 10 %
Neutro Abs: 2.8 10*3/uL (ref 1.7–7.7)
Neutrophils Relative %: 57 %
Platelets: 291 10*3/uL (ref 150–400)
RBC: 3.54 MIL/uL — ABNORMAL LOW (ref 4.22–5.81)
RDW: 11.6 % (ref 11.5–15.5)
WBC: 4.9 10*3/uL (ref 4.0–10.5)
nRBC: 0 % (ref 0.0–0.2)

## 2022-11-02 ENCOUNTER — Encounter: Payer: Self-pay | Admitting: Oncology

## 2022-11-20 ENCOUNTER — Telehealth: Payer: Self-pay | Admitting: Internal Medicine

## 2022-11-20 NOTE — Telephone Encounter (Signed)
Noted  

## 2022-11-20 NOTE — Telephone Encounter (Signed)
Patient called and stated that he will send Flu, RSV, Covid shot through his my chart.

## 2022-12-07 HISTORY — PX: MULTIPLE TOOTH EXTRACTIONS: SHX2053

## 2022-12-17 ENCOUNTER — Encounter: Payer: Self-pay | Admitting: Internal Medicine

## 2022-12-17 ENCOUNTER — Ambulatory Visit: Payer: Medicare HMO | Admitting: Internal Medicine

## 2022-12-17 VITALS — BP 122/82 | HR 94 | Temp 97.4°F | Ht 67.5 in | Wt 201.0 lb

## 2022-12-17 DIAGNOSIS — K219 Gastro-esophageal reflux disease without esophagitis: Secondary | ICD-10-CM | POA: Diagnosis not present

## 2022-12-17 DIAGNOSIS — Z0000 Encounter for general adult medical examination without abnormal findings: Secondary | ICD-10-CM

## 2022-12-17 DIAGNOSIS — D473 Essential (hemorrhagic) thrombocythemia: Secondary | ICD-10-CM

## 2022-12-17 NOTE — Progress Notes (Signed)
Subjective:    Patient ID: Leroy Givens Sr., male    DOB: 07-Jan-1951, 72 y.o.   MRN: [id number]  HPI Here for Medicare wellness visit and follow up of chronic health conditions Reviewed advanced directives Reviewed other doctors---Dr Rao---hematology, [facility], Dr Derry Lory, [facility] No hospitalizations or surgery (except for a skin cancer) Vision fine Has hearing aides --not much difference Regular exercise No falls No depression or anhedonia Independent with instrumental ADLs No sig memory problems  Continues on hydroxyrea Last platelet count was normal On aspirin  No reflux issues Uses tums prn only No dysphagia  Current Outpatient Medications on File Prior to Visit  Medication Sig Dispense Refill   aspirin 81 MG chewable tablet Chew by mouth daily.     cholecalciferol (VITAMIN D3) 25 MCG (1000 UNIT) tablet Take 1,000 Units by mouth daily.     hydroxyurea (HYDREA) 500 MG capsule TAKE ONE CAPSULE BY MOUTH TWICE DAILY (WITH FOOD TO MINIMIZE GI SIDE EFFECTS) 180 capsule 1   Omega-3 Fatty Acids (OMEGA 3 500 PO) Take by mouth.     vitamin B-12 (CYANOCOBALAMIN) 1000 MCG tablet Take 1,000 mcg by mouth daily.     No current facility-administered medications on file prior to visit.    Allergies  Allergen Reactions   Other Other (See Comments) and Shortness Of Breath    " Fresh " seafood... Nausea, dyspnea "Seafood but not shellfish"    Past Medical History:  Diagnosis Date   Anxiety    GERD (gastroesophageal reflux disease)    Hyperlipidemia    Personal history of colonic polyps     Past Surgical History:  Procedure Laterality Date   CATARACT EXTRACTION W/ INTRAOCULAR LENS  IMPLANT, BILATERAL  12/15-R  1/16-- L   CYST EXCISION  30 years ago   from back   SKIN CANCER EXCISION Left    Squamous cell skin cancer removal from left wrist   skin cancer removal     VASECTOMY      Family History  Problem Relation Age of Onset    Stroke Father    Breast cancer Sister        breast cancer   Arthritis Brother    CAD Neg Hx    Diabetes Neg Hx     Social History   Socioeconomic History   Marital status: Married    Spouse name: Not on file   Number of children: 4   Years of education: Not on file   Highest education level: Not on file  Occupational History   Occupation: retired  Tobacco Use   Smoking status: Former    Types: Cigarettes    Quit date: 12/07/2005    Years since quitting: 17.0    Passive exposure: Never   Smokeless tobacco: Never  Vaping Use   Vaping Use: Never used  Substance and Sexual Activity   Alcohol use: Yes    Alcohol/week: 3.0 standard drinks of alcohol    Types: 3 Cans of beer per week   Drug use: No   Sexual activity: Yes  Other Topics Concern   Not on file  Social History Narrative   Has living will   Now with health care POA--- would want wife, then son Gwyndolyn Saxon   Would accept resuscitation   No tube feeds if cognitively unaware   Social Determinants of Health   Financial Resource Strain: Not on file  Food Insecurity: Not on file  Transportation Needs: Not on file  Physical Activity:  Not on file  Stress: Not on file  Social Connections: Not on file  Intimate Partner Violence: Not on file   Review of Systems Appetite is good Weight is stable Sleeps okay Wears seat belt Has been getting dental work--upper and lower partials Bowels move fine--no blood Voids well. Stream is okay--and empties well No suspicious skin lesions now No chest pain or SOB No dizziness or syncope No edema No sig back or joint pains     Objective:   Physical Exam Constitutional:      Appearance: Normal appearance.  HENT:     Mouth/Throat:     Comments: No lesions Eyes:     Conjunctiva/sclera: Conjunctivae normal.     Pupils: Pupils are equal, round, and reactive to light.  Cardiovascular:     Rate and Rhythm: Normal rate and regular rhythm.     Pulses: Normal pulses.     Heart  sounds: No murmur heard.    No gallop.  Pulmonary:     Effort: Pulmonary effort is normal.     Breath sounds: Normal breath sounds. No wheezing or rales.  Abdominal:     Palpations: Abdomen is soft.     Tenderness: There is no abdominal tenderness.  Musculoskeletal:     Cervical back: Neck supple.     Right lower leg: No edema.     Left lower leg: No edema.  Lymphadenopathy:     Cervical: No cervical adenopathy.  Skin:    Findings: No lesion or rash.  Neurological:     General: No focal deficit present.     Mental Status: He is alert and oriented to person, place, and time.     Comments: Word naming-- 12/1 minute Recall 3/3  Psychiatric:        Mood and Affect: Mood normal.        Behavior: Behavior normal.            Assessment & Plan:

## 2022-12-17 NOTE — Assessment & Plan Note (Signed)
Better Only needs tums once in a while

## 2022-12-17 NOTE — Assessment & Plan Note (Signed)
I have personally reviewed the Medicare Annual Wellness questionnaire and have noted 1. The patient's medical and social history 2. Their use of alcohol, tobacco or illicit drugs 3. Their current medications and supplements 4. The patient's functional ability including ADL's, fall risks, home safety risks and hearing or visual             impairment. 5. Diet and physical activities 6. Evidence for depression or mood disorders  The patients weight, height, BMI and visual acuity have been recorded in the chart I have made referrals, counseling and provided education to the patient based review of the above and I have provided the pt with a written personalized care plan for preventive services. I have provided you with a copy of your personalized plan for preventive services. Please take the time to review along with your updated medication list.  Exercises regularly--fairly intense Had flu/updated COVID and shingrix vaccines Colon due again in 2026 due to polyps last year Done with PSA testing

## 2022-12-17 NOTE — Assessment & Plan Note (Signed)
Doing well on the hytdroxyurea '500mg'$  bid

## 2023-02-01 ENCOUNTER — Inpatient Hospital Stay: Payer: Medicare HMO | Attending: Oncology

## 2023-02-01 DIAGNOSIS — D473 Essential (hemorrhagic) thrombocythemia: Secondary | ICD-10-CM | POA: Diagnosis present

## 2023-02-01 DIAGNOSIS — Z87891 Personal history of nicotine dependence: Secondary | ICD-10-CM | POA: Insufficient documentation

## 2023-02-01 DIAGNOSIS — D7589 Other specified diseases of blood and blood-forming organs: Secondary | ICD-10-CM | POA: Diagnosis not present

## 2023-02-01 LAB — COMPREHENSIVE METABOLIC PANEL
ALT: 17 U/L (ref 0–44)
AST: 23 U/L (ref 15–41)
Albumin: 4.4 g/dL (ref 3.5–5.0)
Alkaline Phosphatase: 56 U/L (ref 38–126)
Anion gap: 9 (ref 5–15)
BUN: 22 mg/dL (ref 8–23)
CO2: 26 mmol/L (ref 22–32)
Calcium: 9 mg/dL (ref 8.9–10.3)
Chloride: 104 mmol/L (ref 98–111)
Creatinine, Ser: 1.07 mg/dL (ref 0.61–1.24)
GFR, Estimated: >60 mL/min (ref >60)
Glucose, Bld: 97 mg/dL (ref 70–99)
Potassium: 4.3 mmol/L (ref 3.5–5.1)
Sodium: 139 mmol/L (ref 135–145)
Total Bilirubin: 0.7 mg/dL (ref 0.3–1.2)
Total Protein: 7.5 g/dL (ref 6.5–8.1)

## 2023-02-01 LAB — CBC WITH DIFFERENTIAL/PLATELET
Abs Immature Granulocytes: 0.02 10*3/uL (ref 0.00–0.07)
Basophils Absolute: 0 10*3/uL (ref 0.0–0.1)
Basophils Relative: 1 %
Eosinophils Absolute: 0 10*3/uL (ref 0.0–0.5)
Eosinophils Relative: 1 %
HCT: 43.2 % (ref 39.0–52.0)
Hemoglobin: 14.1 g/dL (ref 13.0–17.0)
Immature Granulocytes: 0 %
Lymphocytes Relative: 20 %
Lymphs Abs: 1.5 10*3/uL (ref 0.7–4.0)
MCH: 35.8 pg — ABNORMAL HIGH (ref 26.0–34.0)
MCHC: 32.6 g/dL (ref 30.0–36.0)
MCV: 109.6 fL — ABNORMAL HIGH (ref 80.0–100.0)
Monocytes Absolute: 0.6 10*3/uL (ref 0.1–1.0)
Monocytes Relative: 8 %
Neutro Abs: 5.2 10*3/uL (ref 1.7–7.7)
Neutrophils Relative %: 70 %
Platelets: 333 10*3/uL (ref 150–400)
RBC: 3.94 MIL/uL — ABNORMAL LOW (ref 4.22–5.81)
RDW: 12.4 % (ref 11.5–15.5)
WBC: 7.3 10*3/uL (ref 4.0–10.5)
nRBC: 0 % (ref 0.0–0.2)

## 2023-02-03 ENCOUNTER — Encounter: Payer: Self-pay | Admitting: Oncology

## 2023-02-03 ENCOUNTER — Inpatient Hospital Stay: Payer: Medicare HMO | Admitting: Oncology

## 2023-02-03 VITALS — BP 153/69 | HR 58 | Temp 98.3°F | Resp 18 | Ht 67.0 in | Wt 208.2 lb

## 2023-02-03 DIAGNOSIS — Z79899 Other long term (current) drug therapy: Secondary | ICD-10-CM

## 2023-02-03 DIAGNOSIS — D473 Essential (hemorrhagic) thrombocythemia: Secondary | ICD-10-CM

## 2023-02-03 NOTE — Progress Notes (Signed)
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 Most recent CBC from 11/27/2019 showed platelet count of 651.  His prior platelet count from 05/30/2018 was 41.  White cell count and hemoglobin have remained normal.  Patient denies any changes in his appetite or unintentional weight loss or night sweats.  Denies any lumps or bumps anywhere.  Denies any prior thromboembolic episodes.    Results of blood work from 12/12/2019 were as follows, CBC showed white count of 6.7, H&H of 13.2/41 with a platelet count of 600.  JAK2 mutation testing was positive.  CAL R and MPL mutation testing negative.  BCR abl fish negative.  ESR normal.  Iron studies normal.   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  Patient was started on Hydrea 500 mg daily.  He was subsequently increased to 500 mg twice a day      Interval history-patient is reporting some improvement in his energy levels after going down on Hydrea dosing from 1000 mg daily to 1000 mg alternating with 500 mg.  Denies any other complaints at this time  ECOG PS- 1 Pain scale- 0   Review of systems- Review of Systems  Constitutional:  Positive for  malaise/fatigue. Negative for chills, fever and weight loss.  HENT:  Negative for congestion, ear discharge and nosebleeds.   Eyes:  Negative for blurred vision.  Respiratory:  Negative for cough, hemoptysis, sputum production, shortness of breath and wheezing.   Cardiovascular:  Negative for chest pain, palpitations, orthopnea and claudication.  Gastrointestinal:  Negative for abdominal pain, blood in stool, constipation, diarrhea, heartburn, melena, nausea and vomiting.  Genitourinary:  Negative for dysuria, flank pain, frequency, hematuria and urgency.  Musculoskeletal:  Negative for back pain, joint pain and myalgias.  Skin:  Negative for rash.  Neurological:  Negative for dizziness, tingling, focal weakness, seizures, weakness and headaches.  Endo/Heme/Allergies:  Does not bruise/bleed easily.  Psychiatric/Behavioral:  Negative for depression and suicidal ideas. The patient does not have insomnia.       Allergies  Allergen Reactions   Other Other (See Comments) and Shortness Of Breath    " Fresh " seafood... Nausea, dyspnea "Seafood but not shellfish"     Past Medical History:  Diagnosis Date   Anxiety    GERD (gastroesophageal reflux disease)    Hyperlipidemia    Personal history of colonic polyps      Past Surgical History:  Procedure Laterality Date   CATARACT EXTRACTION W/ INTRAOCULAR LENS  IMPLANT, BILATERAL  12/15-R  1/16-- L   CYST EXCISION  30 years ago   from back   SKIN CANCER EXCISION Left    Squamous cell skin cancer removal from left wrist   skin cancer removal     VASECTOMY      Social History   Socioeconomic History   Marital  status: Married    Spouse name: Not on file   Number of children: 4   Years of education: Not on file   Highest education level: Not on file  Occupational History   Occupation: retired  Tobacco Use   Smoking status: Former    Types: Cigarettes    Quit date: 12/07/2005    Years since quitting: 17.1    Passive exposure:  Never   Smokeless tobacco: Never  Vaping Use   Vaping Use: Never used  Substance and Sexual Activity   Alcohol use: Yes    Alcohol/week: 3.0 standard drinks of alcohol    Types: 3 Cans of beer per week   Drug use: No   Sexual activity: Yes  Other Topics Concern   Not on file  Social History Narrative   Has living will   Now with health care POA--- would want wife, then son Gwyndolyn Saxon   Would accept resuscitation   No tube feeds if cognitively unaware   Social Determinants of Health   Financial Resource Strain: Not on file  Food Insecurity: Not on file  Transportation Needs: Not on file  Physical Activity: Not on file  Stress: Not on file  Social Connections: Not on file  Intimate Partner Violence: Not on file    Family History  Problem Relation Age of Onset   Stroke Father    Breast cancer Sister        breast cancer   Arthritis Brother    CAD Neg Hx    Diabetes Neg Hx      Current Outpatient Medications:    aspirin 81 MG chewable tablet, Chew by mouth daily., Disp: , Rfl:    cholecalciferol (VITAMIN D3) 25 MCG (1000 UNIT) tablet, Take 1,000 Units by mouth daily., Disp: , Rfl:    hydroxyurea (HYDREA) 500 MG capsule, TAKE ONE CAPSULE BY MOUTH TWICE DAILY (WITH FOOD TO MINIMIZE GI SIDE EFFECTS), Disp: 180 capsule, Rfl: 1   Omega-3 Fatty Acids (OMEGA 3 500 PO), Take by mouth., Disp: , Rfl:    vitamin B-12 (CYANOCOBALAMIN) 1000 MCG tablet, Take 1,000 mcg by mouth daily., Disp: , Rfl:   Physical exam:  Vitals:   02/03/23 1331  BP: (!) 153/69  Pulse: (!) 58  Resp: 18  Temp: 98.3 F (36.8 C)  TempSrc: Tympanic  SpO2: 100%  Weight: 208 lb 3.2 oz (94.4 kg)  Height: '5\' 7"'$  (1.702 m)   Physical Exam Cardiovascular:     Rate and Rhythm: Normal rate and regular rhythm.     Heart sounds: Normal heart sounds.  Pulmonary:     Effort: Pulmonary effort is normal.     Breath sounds: Normal breath sounds.  Abdominal:     General: Bowel sounds are normal.     Palpations:  Abdomen is soft.     Comments: No palpable splenomegaly  Skin:    General: Skin is warm and dry.  Neurological:     Mental Status: He is alert and oriented to person, place, and time.         Latest Ref Rng & Units 02/01/2023    1:13 PM  CMP  Glucose 70 - 99 mg/dL 97   BUN 8 - 23 mg/dL 22   Creatinine 0.61 - 1.24 mg/dL 1.07   Sodium 135 - 145 mmol/L 139   Potassium 3.5 - 5.1 mmol/L 4.3   Chloride 98 - 111 mmol/L 104   CO2 22 - 32 mmol/L 26   Calcium 8.9 - 10.3  mg/dL 9.0   Total Protein 6.5 - 8.1 g/dL 7.5   Total Bilirubin 0.3 - 1.2 mg/dL 0.7   Alkaline Phos 38 - 126 U/L 56   AST 15 - 41 U/L 23   ALT 0 - 44 U/L 17       Latest Ref Rng & Units 02/01/2023    1:13 PM  CBC  WBC 4.0 - 10.5 K/uL 7.3   Hemoglobin 13.0 - 17.0 g/dL 14.1   Hematocrit 39.0 - 52.0 % 43.2   Platelets 150 - 400 K/uL 333      Assessment and plan- Patient is a 72 y.o. 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  Platelets are at goal less than 400.  White count and hemoglobin is normal.  He will stay on this present dosing and I will see him back in 6 months with labs.  Continue low-dose aspirin.  Macrocytosis secondary to Hydrea.   Visit Diagnosis 1. Essential thrombocytosis ([place])   2. High risk medication use      Dr. Randa Evens, MD, MPH [facility] at [facility] [phone] 02/03/2023 3:39 PM

## 2023-02-03 NOTE — Progress Notes (Signed)
Patient has some concerns about his medication.

## 2023-02-07 ENCOUNTER — Encounter: Payer: Self-pay | Admitting: Oncology

## 2023-02-08 ENCOUNTER — Other Ambulatory Visit: Payer: PPO

## 2023-02-08 ENCOUNTER — Telehealth: Payer: Self-pay | Admitting: *Deleted

## 2023-02-08 ENCOUNTER — Telehealth: Payer: Self-pay

## 2023-02-08 ENCOUNTER — Encounter: Payer: Self-pay | Admitting: *Deleted

## 2023-02-08 NOTE — Telephone Encounter (Signed)
We have spoken to his wife because he did not answer. There is a plan in place for him.

## 2023-02-08 NOTE — Telephone Encounter (Signed)
Patient called stating when he was last seen spoke to Dr. Janese Banks regarding a smaller bottle( 3 month instead of 6 month) prescription for Hydrea because he is traveling and doesn't want to take a huge bottle. He states Dr. Janese Banks agreed but the pharmacy said it hasn't been called in. Please call him at [phone] once sent because he is traveling out the country today and is really concerned, thanks

## 2023-02-08 NOTE — Telephone Encounter (Signed)
I called the wife and let her know that the pharmacy can not accept hydrea for another rx. He can take the pills to [facility] and they can put the amount of his pills he needs for the trip and they will put them in a new bottle and make label for it for him to go on trip. Wife will tell  him this above

## 2023-02-09 ENCOUNTER — Ambulatory Visit: Payer: PPO | Admitting: Oncology

## 2023-02-09 ENCOUNTER — Other Ambulatory Visit: Payer: PPO

## 2023-06-08 ENCOUNTER — Telehealth: Payer: Self-pay | Admitting: *Deleted

## 2023-06-08 ENCOUNTER — Encounter: Payer: Self-pay | Admitting: Oncology

## 2023-06-08 MED ORDER — HYDROXYUREA 500 MG PO CAPS: ORAL_CAPSULE | ORAL | 1 refills | Status: DC

## 2023-06-08 NOTE — Telephone Encounter (Signed)
Pt called for refill and he is now taking 2 pills 1 day and next day 1 tab. Alternating each day. Rx sent in . I also called his wife to make sure this is the regimen. She confirmed.

## 2023-08-04 ENCOUNTER — Inpatient Hospital Stay: Payer: Medicare HMO | Attending: Oncology

## 2023-08-04 DIAGNOSIS — D473 Essential (hemorrhagic) thrombocythemia: Secondary | ICD-10-CM | POA: Insufficient documentation

## 2023-08-04 DIAGNOSIS — Z87891 Personal history of nicotine dependence: Secondary | ICD-10-CM | POA: Diagnosis not present

## 2023-08-04 DIAGNOSIS — D75839 Thrombocytosis, unspecified: Secondary | ICD-10-CM | POA: Insufficient documentation

## 2023-08-04 LAB — CBC WITH DIFFERENTIAL/PLATELET
Abs Immature Granulocytes: 0.02 10*3/uL (ref 0.00–0.07)
Basophils Absolute: 0.1 10*3/uL (ref 0.0–0.1)
Basophils Relative: 1 %
Eosinophils Absolute: 0.1 10*3/uL (ref 0.0–0.5)
Eosinophils Relative: 1 %
HCT: 41.5 % (ref 39.0–52.0)
Hemoglobin: 14 g/dL (ref 13.0–17.0)
Immature Granulocytes: 0 %
Lymphocytes Relative: 25 %
Lymphs Abs: 1.2 10*3/uL (ref 0.7–4.0)
MCH: 36.3 pg — ABNORMAL HIGH (ref 26.0–34.0)
MCHC: 33.7 g/dL (ref 30.0–36.0)
MCV: 107.5 fL — ABNORMAL HIGH (ref 80.0–100.0)
Monocytes Absolute: 0.3 10*3/uL (ref 0.1–1.0)
Monocytes Relative: 7 %
Neutro Abs: 3.3 10*3/uL (ref 1.7–7.7)
Neutrophils Relative %: 66 %
Platelets: 325 10*3/uL (ref 150–400)
RBC: 3.86 MIL/uL — ABNORMAL LOW (ref 4.22–5.81)
RDW: 12.1 % (ref 11.5–15.5)
WBC: 4.9 10*3/uL (ref 4.0–10.5)
nRBC: 0 % (ref 0.0–0.2)

## 2023-08-04 LAB — COMPREHENSIVE METABOLIC PANEL
ALT: 19 U/L (ref 0–44)
AST: 25 U/L (ref 15–41)
Albumin: 4.5 g/dL (ref 3.5–5.0)
Alkaline Phosphatase: 62 U/L (ref 38–126)
Anion gap: 7 (ref 5–15)
BUN: 22 mg/dL (ref 8–23)
CO2: 26 mmol/L (ref 22–32)
Calcium: 9.3 mg/dL (ref 8.9–10.3)
Chloride: 107 mmol/L (ref 98–111)
Creatinine, Ser: 1.05 mg/dL (ref 0.61–1.24)
GFR, Estimated: >60 mL/min (ref >60)
Glucose, Bld: 100 mg/dL — ABNORMAL HIGH (ref 70–99)
Potassium: 4.1 mmol/L (ref 3.5–5.1)
Sodium: 140 mmol/L (ref 135–145)
Total Bilirubin: 0.8 mg/dL (ref 0.3–1.2)
Total Protein: 7.4 g/dL (ref 6.5–8.1)

## 2023-08-06 ENCOUNTER — Inpatient Hospital Stay: Payer: Medicare HMO | Admitting: Oncology

## 2023-08-06 DIAGNOSIS — D473 Essential (hemorrhagic) thrombocythemia: Secondary | ICD-10-CM

## 2023-08-06 DIAGNOSIS — Z79899 Other long term (current) drug therapy: Secondary | ICD-10-CM | POA: Diagnosis not present

## 2023-08-08 ENCOUNTER — Encounter: Payer: Self-pay | Admitting: Oncology

## 2023-08-08 NOTE — Progress Notes (Signed)
I connected with Leroy Morton on 08/08/23 at  3:00 PM EDT by video enabled telemedicine visit and verified that I am speaking with the correct person using two identifiers.   I discussed the limitations, risks, security and privacy concerns of performing an evaluation and management service by telemedicine and the availability of in-person appointments. I also discussed with the patient that there may be a patient responsible charge related to this service. The patient expressed understanding and agreed to proceed.  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 Most recent CBC from 11/27/2019 showed platelet count of 651.  His prior platelet count from 05/30/2018 was 41.  White cell count and hemoglobin have remained normal.  Patient denies any changes in his appetite or unintentional weight loss or night sweats.  Denies any lumps or bumps anywhere.  Denies any prior thromboembolic episodes.    Results of blood work from 12/12/2019 were as follows, CBC showed white count of 6.7, H&H of 13.2/41 with a platelet count of 600.  JAK2 mutation testing was positive.  CAL R and MPL mutation testing negative.  BCR abl fish negative.  ESR normal.  Iron studies normal.   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  Patient was started on Hydrea 500 mg daily.  He was subsequently increased to 500 mg twice a day    Interval history tolerating Hydrea well at the dose of 500 mg alternating with 1000 mg.  Denies any mouth ulcers headaches dizziness or leg  ulcers.  No history of any thrombosis or strokelike symptoms.   Review of Systems  Constitutional:  Negative for chills, fever, malaise/fatigue and weight loss.  HENT:  Negative for congestion, ear discharge and nosebleeds.   Eyes:  Negative for blurred vision.  Respiratory:  Negative for cough, hemoptysis, sputum production, shortness of breath and wheezing.   Cardiovascular:  Negative for chest pain, palpitations, orthopnea and claudication.  Gastrointestinal:  Negative for abdominal pain, blood in stool, constipation, diarrhea, heartburn, melena, nausea and vomiting.  Genitourinary:  Negative for dysuria, flank pain, frequency, hematuria and urgency.  Musculoskeletal:  Negative for back pain, joint pain and myalgias.  Skin:  Negative for rash.  Neurological:  Negative for dizziness, tingling, focal weakness, seizures, weakness and headaches.  Endo/Heme/Allergies:  Does not bruise/bleed easily.  Psychiatric/Behavioral:  Negative for depression and suicidal ideas. The patient does not have insomnia.     Allergies  Allergen Reactions   Other Other (See Comments) and Shortness Of Breath    " Fresh " seafood... Nausea, dyspnea "Seafood but not shellfish"    Past Medical History:  Diagnosis Date   Anxiety    GERD (gastroesophageal reflux disease)    Hyperlipidemia    Personal history of colonic polyps     Past Surgical History:  Procedure Laterality Date   CATARACT EXTRACTION W/ INTRAOCULAR LENS  IMPLANT, BILATERAL  12/15-R  1/16-- L   CYST EXCISION  30 years ago   from back   SKIN CANCER EXCISION Left    Squamous cell skin cancer removal from left wrist   skin cancer removal     VASECTOMY  Social History   Socioeconomic History   Marital status: Married    Spouse name: Not on file   Number of children: 4   Years of education: Not on file   Highest education level: Not on file  Occupational History   Occupation: retired  Tobacco Use   Smoking status: Former     Current packs/day: 0.00    Types: Cigarettes    Quit date: 12/07/2005    Years since quitting: 17.6    Passive exposure: Never   Smokeless tobacco: Never  Vaping Use   Vaping status: Never Used  Substance and Sexual Activity   Alcohol use: Yes    Alcohol/week: 3.0 standard drinks of alcohol    Types: 3 Cans of beer per week   Drug use: No   Sexual activity: Yes  Other Topics Concern   Not on file  Social History Narrative   Has living will   Now with health care POA--- would want wife, then son Leroy Morton   Would accept resuscitation   No tube feeds if cognitively unaware   Social Determinants of Health   Financial Resource Strain: Not on file  Food Insecurity: Not on file  Transportation Needs: Not on file  Physical Activity: Not on file  Stress: Not on file  Social Connections: Not on file  Intimate Partner Violence: Not on file    Family History  Problem Relation Age of Onset   Stroke Father    Breast cancer Sister        breast cancer   Arthritis Brother    CAD Neg Hx    Diabetes Neg Hx      Current Outpatient Medications:    aspirin 81 MG chewable tablet, Chew by mouth daily., Disp: , Rfl:    cholecalciferol (VITAMIN D3) 25 MCG (1000 UNIT) tablet, Take 1,000 Units by mouth daily., Disp: , Rfl:    hydroxyurea (HYDREA) 500 MG capsule, May take with food to minimize GI side effects., pt will take 2- 500 mg 1 day and next day  1 -500 mg alternating every day, Disp: 48 capsule, Rfl: 1   Omega-3 Fatty Acids (OMEGA 3 500 PO), Take by mouth., Disp: , Rfl:    vitamin B-12 (CYANOCOBALAMIN) 1000 MCG tablet, Take 1,000 mcg by mouth daily., Disp: , Rfl:   No results found.  No images are attached to the encounter.      Latest Ref Rng & Units 08/04/2023    1:19 PM  CMP  Glucose 70 - 99 mg/dL 332   BUN 8 - 23 mg/dL 22   Creatinine 9.51 - 1.24 mg/dL 8.84   Sodium 166 - 063 mmol/L 140   Potassium 3.5 - 5.1 mmol/L 4.1   Chloride 98 - 111 mmol/L 107   CO2 22 - 32 mmol/L  26   Calcium 8.9 - 10.3 mg/dL 9.3   Total Protein 6.5 - 8.1 g/dL 7.4   Total Bilirubin 0.3 - 1.2 mg/dL 0.8   Alkaline Phos 38 - 126 U/L 62   AST 15 - 41 U/L 25   ALT 0 - 44 U/L 19       Latest Ref Rng & Units 08/04/2023    1:19 PM  CBC  WBC 4.0 - 10.5 K/uL 4.9   Hemoglobin 13.0 - 17.0 g/dL 01.6   Hematocrit 01.0 - 52.0 % 41.5   Platelets 150 - 400 K/uL 325      Observation/objective: Appears in no acute distress over video visit today.  Breathing is nonlabored  Assessment and plan: Patient is a 72 year old male with a history of high risk essential thrombocytosis on Hydrea and this is a routine follow-up visit   Patient is doing well at present dose of Hydrea 500 mg alternating with 1000 mg.  Platelets are at goal less than 400.  Hemoglobin and white count are normal.  He is also on low-dose aspirin.  Given the TFS counts I will repeat labs in 6 months and see him back in 1 year  Follow-up instructions:  I discussed the assessment and treatment plan with the patient. The patient was provided an opportunity to ask questions and all were answered. The patient agreed with the plan and demonstrated an understanding of the instructions.   The patient was advised to call back or seek an in-person evaluation if the symptoms worsen or if the condition fails to improve as anticipated.  I provided 12 minutes of face-to-face video visit time during this encounter, and > 50% was spent counseling as documented under my assessment & plan and review of labs  Visit Diagnosis: 1. Essential thrombocytosis (HCC)   2. High risk medication use     Dr. Owens Shark, MD, MPH [facility] Tel- [phone] 08/08/2023 5:10 PM

## 2023-08-19 ENCOUNTER — Encounter: Payer: Self-pay | Admitting: Oncology

## 2023-08-20 ENCOUNTER — Other Ambulatory Visit: Payer: Self-pay | Admitting: Oncology

## 2023-08-20 NOTE — Telephone Encounter (Signed)
Component Ref Range & Units 2 wk ago (08/04/23) 6 mo ago (02/01/23) 9 mo ago (10/28/22) 11 mo ago (09/15/22) 1 yr ago (07/31/22) 1 yr ago (02/02/22) 1 yr ago (12/15/21)  WBC 4.0 - 10.5 K/uL 4.9 7.3 4.9 6.4 5.7 4.0 4.8  RBC 4.22 - 5.81 MIL/uL 3.86 Low  3.94 Low  3.54 Low  3.53 Low  3.33 Low  3.38 Low  3.50 Low  R  Hemoglobin 13.0 - 17.0 g/dL 40.9 81.1 91.4 78.2 95.6 13.1 13.3  HCT 39.0 - 52.0 % 41.5 43.2 39.1 39.5 38.3 Low  39.1 40.7  MCV 80.0 - 100.0 fL 107.5 High  109.6 High  110.5 High  111.9 High  115.0 High  115.7 High  116.2 Repeated and verified X2. High  R  MCH 26.0 - 34.0 pg 36.3 High  35.8 High  37.3 High  38.0 High  39.3 High  38.8 High    MCHC 30.0 - 36.0 g/dL 21.3 08.6 57.8 46.9 62.9 33.5 32.8  RDW 11.5 - 15.5 % 12.1 12.4 11.6 11.6 11.9 12.1 12.6  Platelets 150 - 400 K/uL 325 333 291 276 295 275 324.0 R  nRBC 0.0 - 0.2 % 0.0 0.0 0.0 0.0 0.0 0.0   Neutrophils Relative % % 66 70 57 64 67 56 59.8 R  Neutro Abs 1.7 - 7.7 K/uL 3.3 5.2 2.8 4.1 3.8 2.2 2.9 R  Lymphocytes Relative % 25 20 30 24 25  32 29.7 R  Lymphs Abs 0.7 - 4.0 K/uL 1.2 1.5 1.5 1.5 1.4 1.3 1.4  Monocytes Relative % 7 8 10 10 7 10  8.7 R  Monocytes Absolute 0.1 - 1.0 K/uL 0.3 0.6 0.5 0.6 0.4 0.4 0.4  Eosinophils Relative % 1 1 2 1  0 1 1.0 R  Eosinophils Absolute 0.0 - 0.5 K/uL 0.1 0.0 0.1 0.1 0.0 0.1 0.0 R  Basophils Relative % 1 1 1 1 1 1  0.8 R  Basophils Absolute 0.0 - 0.1 K/uL 0.1 0.0 0.1 0.1 0.0 0.0 0.0  Immature Granulocytes % 0 0 0 0 0 0   Abs Immature Granulocytes 0.00 - 0.07 K/uL 0.02 0.02 CM 0.01 CM 0.01 CM 0.02 CM 0.01 CM   Comment: Performed at [facility], [address] [place], [place] [address]  Resulting Agency [facility] [place] [facility]         Specimen Collected: 08/04/23 13:19 Last Resulted: 08/04/23 13:29     Component Ref Range & Units 2 wk ago (08/04/23) 6 mo ago (02/01/23) 1 yr ago (07/31/22) 1 yr  ago (02/02/22) 1 yr ago (12/15/21) 2 yr ago (06/16/21) 2 yr ago (03/17/21)  Sodium 135 - 145 mmol/L 140 139 139 137 137 R 136 138  Potassium 3.5 - 5.1 mmol/L 4.1 4.3 3.8 3.6 3.8 R 3.9 4.6  Chloride 98 - 111 mmol/L 107 104 107 104 102 R 103 105  CO2 22 - 32 mmol/L 26 26 25 27 28  R 24 26  Glucose, Bld 70 - 99 mg/dL 324 High  97 CM 401 High  CM 109 High  CM 93 113 High  CM 96 CM  Comment: Glucose reference range applies only to samples taken after fasting for at least 8 hours.  BUN 8 - 23 mg/dL 22 22 24  High  21 19 R 17 19  Creatinine, Ser 0.61 - 1.24 mg/dL 0.27 2.53 6.64 4.03 4.74 R 0.83 0.92  Calcium  8.9 - 10.3 mg/dL 9.3 9.0 8.8 Low  8.5 Low  8.9 R 8.7 Low  8.7 Low   Total Protein 6.5 - 8.1 g/dL 7.4 7.5 7.3 7.0 7.1 R 7.2 7.0  Albumin 3.5 - 5.0 g/dL 4.5 4.4 4.2 4.2 4.4 R 4.2 4.4  AST 15 - 41 U/L 25 23 27 25  90 High  R 25 27  ALT 0 - 44 U/L 19 17 20 18  39 R 19 18  Alkaline Phosphatase 38 - 126 U/L 62 56 55 53 74 R 58 54  Total Bilirubin 0.3 - 1.2 mg/dL 0.8 0.7 1.1 0.2 Low  0.8 R 0.9 0.8  GFR, Estimated >60 mL/min >60 >60 CM >60 CM >60 CM  >60 CM >60 CM  Comment: (NOTE) Calculated using the CKD-EPI Creatinine Equation (2021)  Anion gap 5 - 15 7 9  CM 7 CM 6 CM  9 CM 7 CM  Comment: Performed at [facility], [address] [place], [place] [address]  Resulting Agency [facility] [place] [facility]         Specimen Collected: 08/04/23 13:19 Last Resulted: 08/04/23 13:46

## 2023-12-21 ENCOUNTER — Ambulatory Visit: Payer: HMO | Admitting: Internal Medicine

## 2023-12-21 ENCOUNTER — Encounter: Payer: Self-pay | Admitting: Internal Medicine

## 2023-12-21 VITALS — BP 138/80 | HR 102 | Temp 98.2°F | Ht 67.0 in | Wt 209.0 lb

## 2023-12-21 DIAGNOSIS — Z23 Encounter for immunization: Secondary | ICD-10-CM

## 2023-12-21 DIAGNOSIS — Z0000 Encounter for general adult medical examination without abnormal findings: Secondary | ICD-10-CM | POA: Diagnosis not present

## 2023-12-21 DIAGNOSIS — D473 Essential (hemorrhagic) thrombocythemia: Secondary | ICD-10-CM

## 2023-12-21 DIAGNOSIS — G479 Sleep disorder, unspecified: Secondary | ICD-10-CM | POA: Diagnosis not present

## 2023-12-21 DIAGNOSIS — K219 Gastro-esophageal reflux disease without esophagitis: Secondary | ICD-10-CM | POA: Diagnosis not present

## 2023-12-21 NOTE — Progress Notes (Signed)
 Hearing Screening - Comments:: Has hearing aids. Aids. Wearing them today Vision Screening - Comments:: April 2024

## 2023-12-21 NOTE — Assessment & Plan Note (Signed)
 Better Just uses tums prn

## 2023-12-21 NOTE — Progress Notes (Signed)
 Subjective:    Patient ID: Leroy FORBES Fabry Sr., male    DOB: 1951/09/25, 73 y.o.   MRN: [id number]  HPI Here for Medicare wellness visit and follow up of chronic health conditions Reviewed advanced directives Reviewed other doctors---Dr Rao--hematology, [facility], [facility], Dr Renae No hospitalizations this year No surgery other than some tooth extractions Still exercises hard Enjoys beer occasionally No tobacco Vision is okay Hearing aides--may help some No falls No depression or anhedonia Independent with instrumental ADLs Mild recall issues--nothing worrisome  Continues on hydroxyurea  for thrombocytosis Feels it affects his bowels---varies from constipation to loose Doesn't use any meds for this  No recent heartburn Rare tums No dysphagia  Current Outpatient Medications on File Prior to Visit  Medication Sig Dispense Refill   aspirin 81 MG chewable tablet Chew by mouth daily.     cholecalciferol (VITAMIN D3) 25 MCG (1000 UNIT) tablet Take 1,000 Units by mouth daily.     hydroxyurea  (HYDREA ) 500 MG capsule TAKE 2 CAPSULES BY MOUTH EVERY OTHER DAY, ALTERNATING WITH 1 CAPSULE BY MOUTH ON THE ALTERNATE DAY. MAY TAKE WITH FOOD TO MINIMIZE GI SIDE EFFECTS 96 capsule 3   Omega-3 Fatty Acids (OMEGA 3 500 PO) Take by mouth.     vitamin B-12 (CYANOCOBALAMIN) 1000 MCG tablet Take 1,000 mcg by mouth daily.     No current facility-administered medications on file prior to visit.    Allergies  Allergen Reactions   Other Other (See Comments) and Shortness Of Breath     Fresh  seafood... Nausea, dyspnea Seafood but not shellfish    Past Medical History:  Diagnosis Date   Anxiety    GERD (gastroesophageal reflux disease)    Hyperlipidemia    Personal history of colonic polyps     Past Surgical History:  Procedure Laterality Date   CATARACT EXTRACTION W/ INTRAOCULAR LENS  IMPLANT, BILATERAL  12/15-R  1/16-- L   CYST EXCISION  30 years ago    from back   MULTIPLE TOOTH EXTRACTIONS  12/07/2022   SKIN CANCER EXCISION Left    Squamous cell skin cancer removal from left wrist   skin cancer removal     VASECTOMY      Family History  Problem Relation Age of Onset   Stroke Father    Breast cancer Sister        breast cancer   Arthritis Brother    CAD Neg Hx    Diabetes Neg Hx     Social History   Socioeconomic History   Marital status: Married    Spouse name: Not on file   Number of children: 4   Years of education: Not on file   Highest education level: Not on file  Occupational History   Occupation: retired  Tobacco Use   Smoking status: Former    Current packs/day: 0.00    Types: Cigarettes    Quit date: 12/07/2005    Years since quitting: 18.0    Passive exposure: Never   Smokeless tobacco: Never  Vaping Use   Vaping status: Never Used  Substance and Sexual Activity   Alcohol use: Yes    Alcohol/week: 3.0 standard drinks of alcohol    Types: 3 Cans of beer per week   Drug use: No   Sexual activity: Yes  Other Topics Concern   Not on file  Social History Narrative   Has living will   Now with health care POA--- would want wife, then son Elsie  Would accept resuscitation   No machines or tube feeds if cognitively unaware   Social Drivers of Corporate Investment Banker Strain: Not on file  Food Insecurity: Not on file  Transportation Needs: Not on file  Physical Activity: Not on file  Stress: Not on file  Social Connections: Not on file  Intimate Partner Violence: Not on file   Review of Systems Appetite is good Weight about the same Sleeps okay Wears sat belt Keeps up with dentist No suspicious skin lesions--sees derm No blood in stool No sig back or joint pains No chest pain or SOB No dizziness or syncope No edema No palpitations     Objective:   Physical Exam Constitutional:      Appearance: Normal appearance.  HENT:     Mouth/Throat:     Pharynx: No oropharyngeal exudate or  posterior oropharyngeal erythema.  Eyes:     Conjunctiva/sclera: Conjunctivae normal.     Pupils: Pupils are equal, round, and reactive to light.  Cardiovascular:     Rate and Rhythm: Normal rate and regular rhythm.     Pulses: Normal pulses.     Heart sounds: No murmur heard.    No gallop.  Pulmonary:     Effort: Pulmonary effort is normal.     Breath sounds: Normal breath sounds. No wheezing or rales.  Abdominal:     Palpations: Abdomen is soft.     Tenderness: There is no abdominal tenderness.  Musculoskeletal:     Cervical back: Neck supple.     Right lower leg: No edema.     Left lower leg: No edema.  Lymphadenopathy:     Cervical: No cervical adenopathy.  Skin:    Findings: No lesion or rash.  Neurological:     General: No focal deficit present.     Mental Status: He is alert and oriented to person, place, and time.     Comments: Mini-cog normal  Psychiatric:        Mood and Affect: Mood normal.        Behavior: Behavior normal.            Assessment & Plan:

## 2023-12-21 NOTE — Addendum Note (Signed)
 Addended by: Eual Fines on: 12/21/2023 12:51 PM   Modules accepted: Orders

## 2023-12-21 NOTE — Assessment & Plan Note (Signed)
 Doing well with the hydroxyurea Follows with Dr Rao--will defer blood work to her

## 2023-12-21 NOTE — Assessment & Plan Note (Signed)
 Better of late No medications for this

## 2023-12-21 NOTE — Assessment & Plan Note (Signed)
 I have personally reviewed the Medicare Annual Wellness questionnaire and have noted 1. The patient's medical and social history 2. Their use of alcohol, tobacco or illicit drugs 3. Their current medications and supplements 4. The patient's functional ability including ADL's, fall risks, home safety risks and hearing or visual             impairment. 5. Diet and physical activities 6. Evidence for depression or mood disorders  The patients weight, height, BMI and visual acuity have been recorded in the chart I have made referrals, counseling and provided education to the patient based review of the above and I have provided the pt with a written personalized care plan for preventive services.  I have provided you with a copy of your personalized plan for preventive services. Please take the time to review along with your updated medication list.  Exercises regularly Colonoscopy due next year No PSA due to age Flu vaccine today COVID update and tetanus at pharmacy

## 2024-01-20 DIAGNOSIS — H52223 Regular astigmatism, bilateral: Secondary | ICD-10-CM | POA: Diagnosis not present

## 2024-01-25 DIAGNOSIS — D225 Melanocytic nevi of trunk: Secondary | ICD-10-CM | POA: Diagnosis not present

## 2024-01-25 DIAGNOSIS — D2271 Melanocytic nevi of right lower limb, including hip: Secondary | ICD-10-CM | POA: Diagnosis not present

## 2024-01-25 DIAGNOSIS — Z08 Encounter for follow-up examination after completed treatment for malignant neoplasm: Secondary | ICD-10-CM | POA: Diagnosis not present

## 2024-01-25 DIAGNOSIS — Z85828 Personal history of other malignant neoplasm of skin: Secondary | ICD-10-CM | POA: Diagnosis not present

## 2024-01-25 DIAGNOSIS — L821 Other seborrheic keratosis: Secondary | ICD-10-CM | POA: Diagnosis not present

## 2024-01-25 DIAGNOSIS — D485 Neoplasm of uncertain behavior of skin: Secondary | ICD-10-CM | POA: Diagnosis not present

## 2024-01-25 DIAGNOSIS — D2261 Melanocytic nevi of right upper limb, including shoulder: Secondary | ICD-10-CM | POA: Diagnosis not present

## 2024-01-25 DIAGNOSIS — D2262 Melanocytic nevi of left upper limb, including shoulder: Secondary | ICD-10-CM | POA: Diagnosis not present

## 2024-01-25 DIAGNOSIS — D2272 Melanocytic nevi of left lower limb, including hip: Secondary | ICD-10-CM | POA: Diagnosis not present

## 2024-01-25 DIAGNOSIS — L570 Actinic keratosis: Secondary | ICD-10-CM | POA: Diagnosis not present

## 2024-02-02 ENCOUNTER — Other Ambulatory Visit: Payer: Self-pay

## 2024-02-04 ENCOUNTER — Inpatient Hospital Stay: Payer: HMO | Attending: Oncology

## 2024-02-04 DIAGNOSIS — D473 Essential (hemorrhagic) thrombocythemia: Secondary | ICD-10-CM | POA: Insufficient documentation

## 2024-02-04 LAB — COMPREHENSIVE METABOLIC PANEL
ALT: 18 U/L (ref 0–44)
AST: 26 U/L (ref 15–41)
Albumin: 4.2 g/dL (ref 3.5–5.0)
Alkaline Phosphatase: 57 U/L (ref 38–126)
Anion gap: 8 (ref 5–15)
BUN: 25 mg/dL — ABNORMAL HIGH (ref 8–23)
CO2: 27 mmol/L (ref 22–32)
Calcium: 9.1 mg/dL (ref 8.9–10.3)
Chloride: 103 mmol/L (ref 98–111)
Creatinine, Ser: 1.07 mg/dL (ref 0.61–1.24)
GFR, Estimated: >60 mL/min (ref >60)
Glucose, Bld: 110 mg/dL — ABNORMAL HIGH (ref 70–99)
Potassium: 4.1 mmol/L (ref 3.5–5.1)
Sodium: 138 mmol/L (ref 135–145)
Total Bilirubin: 0.8 mg/dL (ref 0.0–1.2)
Total Protein: 7.3 g/dL (ref 6.5–8.1)

## 2024-02-04 LAB — CBC WITH DIFFERENTIAL/PLATELET
Abs Immature Granulocytes: 0.01 10*3/uL (ref 0.00–0.07)
Basophils Absolute: 0.1 10*3/uL (ref 0.0–0.1)
Basophils Relative: 1 %
Eosinophils Absolute: 0.1 10*3/uL (ref 0.0–0.5)
Eosinophils Relative: 2 %
HCT: 41.7 % (ref 39.0–52.0)
Hemoglobin: 14.1 g/dL (ref 13.0–17.0)
Immature Granulocytes: 0 %
Lymphocytes Relative: 35 %
Lymphs Abs: 1.4 10*3/uL (ref 0.7–4.0)
MCH: 36.4 pg — ABNORMAL HIGH (ref 26.0–34.0)
MCHC: 33.8 g/dL (ref 30.0–36.0)
MCV: 107.8 fL — ABNORMAL HIGH (ref 80.0–100.0)
Monocytes Absolute: 0.4 10*3/uL (ref 0.1–1.0)
Monocytes Relative: 9 %
Neutro Abs: 2 10*3/uL (ref 1.7–7.7)
Neutrophils Relative %: 53 %
Platelets: 323 10*3/uL (ref 150–400)
RBC: 3.87 MIL/uL — ABNORMAL LOW (ref 4.22–5.81)
RDW: 11.9 % (ref 11.5–15.5)
WBC: 3.9 10*3/uL — ABNORMAL LOW (ref 4.0–10.5)
nRBC: 0 % (ref 0.0–0.2)

## 2024-02-24 DIAGNOSIS — C44619 Basal cell carcinoma of skin of left upper limb, including shoulder: Secondary | ICD-10-CM | POA: Diagnosis not present

## 2024-04-24 ENCOUNTER — Other Ambulatory Visit: Payer: Self-pay | Admitting: Oncology

## 2024-04-24 DIAGNOSIS — D473 Essential (hemorrhagic) thrombocythemia: Secondary | ICD-10-CM

## 2024-06-07 DIAGNOSIS — K080 Exfoliation of teeth due to systemic causes: Secondary | ICD-10-CM | POA: Diagnosis not present

## 2024-08-04 ENCOUNTER — Inpatient Hospital Stay: Payer: Self-pay | Attending: Oncology

## 2024-08-04 DIAGNOSIS — D473 Essential (hemorrhagic) thrombocythemia: Secondary | ICD-10-CM | POA: Diagnosis not present

## 2024-08-04 LAB — COMPREHENSIVE METABOLIC PANEL WITH GFR
ALT: 18 U/L (ref 0–44)
AST: 31 U/L (ref 15–41)
Albumin: 4.1 g/dL (ref 3.5–5.0)
Alkaline Phosphatase: 61 U/L (ref 38–126)
Anion gap: 8 (ref 5–15)
BUN: 23 mg/dL (ref 8–23)
CO2: 24 mmol/L (ref 22–32)
Calcium: 9.1 mg/dL (ref 8.9–10.3)
Chloride: 106 mmol/L (ref 98–111)
Creatinine, Ser: 1.1 mg/dL (ref 0.61–1.24)
GFR, Estimated: >60 mL/min (ref >60)
Glucose, Bld: 150 mg/dL — ABNORMAL HIGH (ref 70–99)
Potassium: 3.8 mmol/L (ref 3.5–5.1)
Sodium: 138 mmol/L (ref 135–145)
Total Bilirubin: 0.9 mg/dL (ref 0.0–1.2)
Total Protein: 7 g/dL (ref 6.5–8.1)

## 2024-08-04 LAB — CBC WITH DIFFERENTIAL/PLATELET
Abs Immature Granulocytes: 0.02 K/uL (ref 0.00–0.07)
Basophils Absolute: 0.1 K/uL (ref 0.0–0.1)
Basophils Relative: 1 %
Eosinophils Absolute: 0.1 K/uL (ref 0.0–0.5)
Eosinophils Relative: 2 %
HCT: 39.8 % (ref 39.0–52.0)
Hemoglobin: 13.3 g/dL (ref 13.0–17.0)
Immature Granulocytes: 0 %
Lymphocytes Relative: 24 %
Lymphs Abs: 1.2 K/uL (ref 0.7–4.0)
MCH: 36.3 pg — ABNORMAL HIGH (ref 26.0–34.0)
MCHC: 33.4 g/dL (ref 30.0–36.0)
MCV: 108.7 fL — ABNORMAL HIGH (ref 80.0–100.0)
Monocytes Absolute: 0.4 K/uL (ref 0.1–1.0)
Monocytes Relative: 9 %
Neutro Abs: 3.1 K/uL (ref 1.7–7.7)
Neutrophils Relative %: 64 %
Platelets: 282 K/uL (ref 150–400)
RBC: 3.66 MIL/uL — ABNORMAL LOW (ref 4.22–5.81)
RDW: 12.2 % (ref 11.5–15.5)
WBC: 4.9 K/uL (ref 4.0–10.5)
nRBC: 0 % (ref 0.0–0.2)

## 2024-08-08 ENCOUNTER — Other Ambulatory Visit: Payer: Medicare HMO

## 2024-08-08 ENCOUNTER — Inpatient Hospital Stay: Payer: Medicare HMO | Attending: Oncology | Admitting: Oncology

## 2024-08-08 ENCOUNTER — Encounter: Payer: Self-pay | Admitting: Oncology

## 2024-08-08 VITALS — BP 139/75 | HR 52 | Temp 97.3°F | Resp 18 | Ht 67.0 in | Wt 214.1 lb

## 2024-08-08 DIAGNOSIS — Z87891 Personal history of nicotine dependence: Secondary | ICD-10-CM | POA: Insufficient documentation

## 2024-08-08 DIAGNOSIS — D473 Essential (hemorrhagic) thrombocythemia: Secondary | ICD-10-CM

## 2024-08-08 DIAGNOSIS — Z79899 Other long term (current) drug therapy: Secondary | ICD-10-CM

## 2024-08-08 MED ORDER — HYDROXYUREA 500 MG PO CAPS: ORAL_CAPSULE | ORAL | 4 refills | Status: AC

## 2024-08-08 NOTE — Progress Notes (Signed)
 Patient doing well. No new or acute concerns at this time.

## 2024-08-08 NOTE — Progress Notes (Signed)
 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 Most recent CBC from 11/27/2019 showed platelet count of 651.  His prior platelet count from 05/30/2018 was 41.  White cell count and hemoglobin have remained normal.  Patient denies any changes in his appetite or unintentional weight loss or night sweats.  Denies any lumps or bumps anywhere.  Denies any prior thromboembolic episodes.    Results of blood work from 12/12/2019 were as follows, CBC showed white count of 6.7, H&H of 13.2/41 with a platelet count of 600.  JAK2 mutation testing was positive.  CAL R and MPL mutation testing negative.  BCR abl fish negative.  ESR normal.  Iron studies normal.   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  Patient is presently on Hydrea  500 mg alternating with 1000 mg  Interval history-tolerating Hydrea  well without any significant side effects.  He is also on low-dose aspirin.  Denies any mouth sores or leg ulcers.  No history of thromboembolic events  ECOG PS- 0 Pain scale- 0   Review of systems- Review of Systems  Constitutional:  Negative for chills, fever,  malaise/fatigue and weight loss.  HENT:  Negative for congestion, ear discharge and nosebleeds.   Eyes:  Negative for blurred vision.  Respiratory:  Negative for cough, hemoptysis, sputum production, shortness of breath and wheezing.   Cardiovascular:  Negative for chest pain, palpitations, orthopnea and claudication.  Gastrointestinal:  Negative for abdominal pain, blood in stool, constipation, diarrhea, heartburn, melena, nausea and vomiting.  Genitourinary:  Negative for dysuria, flank pain, frequency, hematuria and urgency.  Musculoskeletal:  Negative for back pain, joint pain and myalgias.  Skin:  Negative for rash.  Neurological:  Negative for dizziness, tingling, focal weakness, seizures, weakness and headaches.  Endo/Heme/Allergies:  Does not bruise/bleed easily.  Psychiatric/Behavioral:  Negative for depression and suicidal ideas. The patient does not have insomnia.       Allergies  Allergen Reactions   Other Other (See Comments) and Shortness Of Breath     Fresh  seafood... Nausea, dyspnea Seafood but not shellfish     Past Medical History:  Diagnosis Date   Anxiety    GERD (gastroesophageal reflux disease)    Hyperlipidemia    Personal history of colonic polyps      Past Surgical History:  Procedure Laterality Date   CATARACT EXTRACTION W/ INTRAOCULAR LENS  IMPLANT, BILATERAL  12/15-R  1/16-- L   CYST EXCISION  30 years ago   from back   MULTIPLE TOOTH EXTRACTIONS  12/07/2022   SKIN CANCER EXCISION Left    Squamous cell skin cancer removal from left wrist   skin cancer removal     VASECTOMY      Social History   Socioeconomic History   Marital status: Married    Spouse name: Not on file  Number of children: 4   Years of education: Not on file   Highest education level: Not on file  Occupational History   Occupation: retired  Tobacco Use   Smoking status: Former    Current packs/day: 0.00    Types: Cigarettes    Quit date: 12/07/2005    Years  since quitting: 18.6    Passive exposure: Never   Smokeless tobacco: Never  Vaping Use   Vaping status: Never Used  Substance and Sexual Activity   Alcohol use: Yes    Alcohol/week: 3.0 standard drinks of alcohol    Types: 3 Cans of beer per week   Drug use: No   Sexual activity: Yes  Other Topics Concern   Not on file  Social History Narrative   Has living will   Now with health care POA--- would want wife, then son Elsie   Would accept resuscitation   No machines or tube feeds if cognitively unaware   Social Drivers of Health   Financial Resource Strain: Not on file  Food Insecurity: No Food Insecurity (08/08/2024)   Hunger Vital Sign    Worried About Running Out of Food in the Last Year: Never true    Ran Out of Food in the Last Year: Never true  Transportation Needs: No Transportation Needs (08/08/2024)   PRAPARE - Administrator, Civil Service (Medical): No    Lack of Transportation (Non-Medical): No  Physical Activity: Not on file  Stress: Not on file  Social Connections: Not on file  Intimate Partner Violence: Not At Risk (08/08/2024)   Humiliation, Afraid, Rape, and Kick questionnaire    Fear of Current or Ex-Partner: No    Emotionally Abused: No    Physically Abused: No    Sexually Abused: No    Family History  Problem Relation Age of Onset   Stroke Father    Breast cancer Sister        breast cancer   Arthritis Brother    CAD Neg Hx    Diabetes Neg Hx      Current Outpatient Medications:    aspirin 81 MG chewable tablet, Chew by mouth daily., Disp: , Rfl:    cholecalciferol (VITAMIN D3) 25 MCG (1000 UNIT) tablet, Take 1,000 Units by mouth daily., Disp: , Rfl:    Omega-3 Fatty Acids (OMEGA 3 500 PO), Take by mouth., Disp: , Rfl:    vitamin B-12 (CYANOCOBALAMIN) 1000 MCG tablet, Take 1,000 mcg by mouth daily., Disp: , Rfl:    hydroxyurea  (HYDREA ) 500 MG capsule, TAKE TWO CAPSULES BY MOUTH EVERY OTHER DAY ALTERNATING WITH 1 CAPSULE BY MOUTH EVERY  OTHER DAY .MAY TAKE WITH FOOD TO MINIMIZE GI SIDE EFFECTS, Disp: 135 capsule, Rfl: 4  Physical exam:  Vitals:   08/08/24 1418 08/08/24 1422  BP: (!) 161/68 139/75  Pulse: (!) 52   Resp: 18   Temp: (!) 97.3 F (36.3 C)   TempSrc: Tympanic   SpO2: 100%   Weight: 214 lb 1.6 oz (97.1 kg)   Height: 5' 7 (1.702 m)    Physical Exam Cardiovascular:     Rate and Rhythm: Normal rate and regular rhythm.     Heart sounds: Normal heart sounds.  Pulmonary:     Effort: Pulmonary effort is normal.     Breath sounds: Normal breath sounds.  Abdominal:     General: Bowel sounds are normal.     Palpations: Abdomen is soft.     Comments: No palpable hepatosplenomegaly  Skin:    General: Skin is warm and dry.  Neurological:     Mental Status: He is alert and oriented to person, place, and time.      I have personally reviewed labs listed below:    Latest Ref Rng & Units 08/04/2024    1:09 PM  CMP  Glucose 70 - 99 mg/dL 849   BUN 8 - 23 mg/dL 23   Creatinine 9.38 - 1.24 mg/dL 8.89   Sodium 864 - 854 mmol/L 138   Potassium 3.5 - 5.1 mmol/L 3.8   Chloride 98 - 111 mmol/L 106   CO2 22 - 32 mmol/L 24   Calcium 8.9 - 10.3 mg/dL 9.1   Total Protein 6.5 - 8.1 g/dL 7.0   Total Bilirubin 0.0 - 1.2 mg/dL 0.9   Alkaline Phos 38 - 126 U/L 61   AST 15 - 41 U/L 31   ALT 0 - 44 U/L 18       Latest Ref Rng & Units 08/04/2024    1:09 PM  CBC  WBC 4.0 - 10.5 K/uL 4.9   Hemoglobin 13.0 - 17.0 g/dL 86.6   Hematocrit 60.9 - 52.0 % 39.8   Platelets 150 - 400 K/uL 282     Assessment and plan- Patient is a 74 y.o. 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  Platelets are at goal less than 100.  White count and hemoglobin are normal.  Macrocytosis secondary to Hydrea .  He will continue Hydrea  until progression or toxicity.  Repeat labs in 6 months in 1 year  and I will see him back in 1 year   Visit Diagnosis 1. Essential thrombocytosis (HCC)      Dr. Annah Skene, MD, MPH [facility] [phone] 08/08/2024 4:44 PM

## 2024-08-30 DIAGNOSIS — D2272 Melanocytic nevi of left lower limb, including hip: Secondary | ICD-10-CM | POA: Diagnosis not present

## 2024-08-30 DIAGNOSIS — L570 Actinic keratosis: Secondary | ICD-10-CM | POA: Diagnosis not present

## 2024-08-30 DIAGNOSIS — D2262 Melanocytic nevi of left upper limb, including shoulder: Secondary | ICD-10-CM | POA: Diagnosis not present

## 2024-08-30 DIAGNOSIS — D2261 Melanocytic nevi of right upper limb, including shoulder: Secondary | ICD-10-CM | POA: Diagnosis not present

## 2024-08-30 DIAGNOSIS — D225 Melanocytic nevi of trunk: Secondary | ICD-10-CM | POA: Diagnosis not present

## 2024-11-15 DIAGNOSIS — K080 Exfoliation of teeth due to systemic causes: Secondary | ICD-10-CM | POA: Diagnosis not present

## 2024-12-21 ENCOUNTER — Ambulatory Visit

## 2024-12-21 VITALS — BP 138/86 | HR 82 | Temp 98.0°F | Ht 67.0 in | Wt 202.0 lb

## 2024-12-21 DIAGNOSIS — E669 Obesity, unspecified: Secondary | ICD-10-CM

## 2024-12-21 DIAGNOSIS — D7589 Other specified diseases of blood and blood-forming organs: Secondary | ICD-10-CM

## 2024-12-21 DIAGNOSIS — E7849 Other hyperlipidemia: Secondary | ICD-10-CM

## 2024-12-21 DIAGNOSIS — E559 Vitamin D deficiency, unspecified: Secondary | ICD-10-CM | POA: Diagnosis not present

## 2024-12-21 DIAGNOSIS — Z0000 Encounter for general adult medical examination without abnormal findings: Secondary | ICD-10-CM

## 2024-12-21 DIAGNOSIS — Z23 Encounter for immunization: Secondary | ICD-10-CM

## 2024-12-21 NOTE — Patient Instructions (Addendum)
 Thank you for visiting [facility] today! Here's what we talked about: - Get latest covid booster from pharmacy - Bring an electronic copy of your living will and power of attorney

## 2024-12-21 NOTE — Progress Notes (Signed)
 "  Assessment & Plan:   This visit was conducted in person. The patient gave informed consent to the use of [employer] AI technology to record the contents of the encounter as documented below.  Assessment & Plan Physical, annual Routine wellness visit with no acute concerns. Blood pressure 138/86 mmHg. Vaccinations updated. Living will and healthcare power of attorney documented. - Administered Prevnar and flu vaccines. - Recommended COVID booster from pharmacy. - Scheduled fasting labs in one week for cholesterol, vitamin D, and vitamin B12. - Scheduled follow-up physical in one year. - Requested electronic copy of living will and healthcare power of attorney for medical record.  I have personally reviewed and have noted: 1. The patient's medical and social history 2. Their use of alcohol, tobacco or illicit drugs 3. Their current medications and supplements 4. The patient's functional ability including ADL's, fall risks, home safety risks and hearing or visual impairment. 5. Diet and physical activities 6. Evidence for depression or mood disorder   Colonoscopy: Colonoscopy in 2023 with [facility] showed precancerous polyps, repeat in 3 to 5 years recommended, scheduled for Feb this year  Labs: Lipid  Immunizations: Prevnar and flu today, covid from pharmacy Diet and Exercise: Good, exercises daily Sleep and mood: Good Dental and vision: UTD ADLs and IADLs: Capable Cognitive: Intact to orientation, naming, recall and repetition Fall risk and home safety: No concerns Health counselling given: Exercise to reduce fall risk   Advanced Directives Patient does have advanced directives including living will. Leroy Morton does not have a copy in the electronic medical record.    Obesity Current BMI of 31, prefers to manage with diet and lifestyle, Leroy Morton is currently exercising. - Counseled on importance of continued exercise   Other hyperlipidemia Cholesterol levels last checked 12 years ago,  did show hyperlipidemia.  Not currently on statin.  No recent labs available. - Ordered fasting cholesterol labs in one week.  Macrocytosis Chronic.  Likely related to hydroxyurea  use. B12 supplementation ongoing. - Ordered vitamin B12 level as part of monitoring  Vitamin D deficiency Managed with supplementation. No recent vitamin D level available. - Ordered vitamin D level as part of monitoring   Follow-up: Return in about 1 year (around 12/21/2025) for CPE with fasting labs in 1wk .        Subjective:   Patient ID: Leroy FORBES Fabry Sr., male    DOB: 06-26-1951  Age: 74 y.o. MRN: [id number]  Patient Care Team: Jimmy Charlie FERNS, MD as PCP - General (Internal Medicine) Dellie Louanne MATSU, MD (General Surgery) Lucio Franky PARAS, OD as Referring Physician (Optometry) Melanee Annah BROCKS, MD as Consulting Physician (Oncology)   CC:  Chief Complaint  Patient presents with   Transfer of Care    Previous pt of Dr Letvak     Leroy E Bertran Sr. is a 74 y.o. male here for annual physical and f/u on chronic conditions, see assessment and plan for further details  Discussed the use of AI scribe software for clinical note transcription with the patient, who gave verbal consent to proceed.  History of Present Illness Leroy SCHALK Sr. Leroy Morton is a 74 year old male who presents for an annual physical exam.  Leroy Morton has a history of colon polyps, with the most recent colonoscopy in 2023 showing precancerous polyps. Leroy Morton is scheduled for a follow-up colonoscopy in February 2026.  Leroy Morton has essential thrombocytosis and is currently taking hydroxyurea  and aspirin for management. Leroy Morton follows up with Dr. Melanee for this condition.  Leroy Morton has a history of acid reflux and high cholesterol, although his cholesterol levels have not been checked in the past 12 years. Leroy Morton takes vitamin D and B12 supplements, omega-3, and aspirin daily. Leroy Morton started taking vitamin D and B12 supplements due to low readings from previous  blood tests.  Leroy Morton reports no significant sleep issues since retiring from work and denies any history of anxiety. Leroy Morton has undergone several surgeries, including skin cancer removals, cataract surgery, a vasectomy, and tooth extractions.  Leroy Morton consumes about three alcoholic drinks per week and quit smoking in 2007. 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 Leroy Morton exercises daily, engaging in activities such as running, floor exercises, and using a treadmill. Leroy Morton follows exercise programs on YouTube.  No issues with mood, sleep, or daily activities. Regular dental and eye check-ups. Leroy Morton has a living will and healthcare power of attorney, with his wife as the primary and his daughter as the alternate.   Depression Screening;    12/21/2024    2:18 PM 08/08/2024    2:16 PM 12/21/2023   12:30 PM 12/21/2023   12:14 PM 12/17/2022    3:21 PM 12/17/2022    2:59 PM 12/15/2021    4:33 PM  PHQ 2/9 Scores  PHQ - 2 Score 0 0 0 0 0 0 0     Anxiety Screening:     No data to display           ROS: Negative unless specifically indicated above in HPI.       Objective:     BP 138/86 (BP Location: Left Arm, Patient Position: Sitting, Cuff Size: Large)   Pulse 82   Temp 98 F (36.7 C) (Oral)   Ht 5' 7 (1.702 m)   Wt 202 lb (91.6 kg)   SpO2 96%   BMI 31.64 kg/m    Physical Exam VITALS: BP- 138/86 GENERAL: Alert, cooperative, well developed, no acute distress, well nourished. HEAD: Normocephalic atraumatic. EYES: Extraocular movements intact BL, pupils round, equal and reactive to light BL, conjunctivae normal BL. EARS: Tympanic membrane, ear canal and external ear normal BL. NOSE: No congestion or rhinorrhea, mucous membranes are moist. THROAT: Pharynx normal, no oropharyngeal exudate or posterior oropharyngeal erythema. CARDIOVASCULAR: Normal heart rate and rhythm, S1 and S2 normal without murmurs. CHEST: Clear to auscultation bilaterally, no wheezes,  rhonchi, or crackles. ABDOMEN: Soft, non tender, non distended, without organomegaly, normal bowel sounds. EXTREMITIES: No cyanosis or edema. NEUROLOGICAL: Oriented to person, place and time, cranial nerves grossly intact, no gait abnormalities, moves all extremities without gross motor or sensory deficit. NECK: Supple, no nuchal rigidity, non tender, no thyromegaly, trachea midline.        Derrien Anschutz K Ravina Milner, MD  12/21/24    Contains text generated by [employer].    "

## 2024-12-21 NOTE — Addendum Note (Signed)
 Addended by: KALLIE CLOTILDA SQUIBB on: 12/21/2024 03:16 PM   Modules accepted: Orders

## 2024-12-28 ENCOUNTER — Other Ambulatory Visit (INDEPENDENT_AMBULATORY_CARE_PROVIDER_SITE_OTHER)

## 2024-12-28 DIAGNOSIS — E559 Vitamin D deficiency, unspecified: Secondary | ICD-10-CM | POA: Diagnosis not present

## 2024-12-28 DIAGNOSIS — D7589 Other specified diseases of blood and blood-forming organs: Secondary | ICD-10-CM

## 2024-12-28 DIAGNOSIS — E7849 Other hyperlipidemia: Secondary | ICD-10-CM

## 2024-12-28 LAB — VITAMIN B12: Vitamin B-12: 979 pg/mL — ABNORMAL HIGH (ref 211–911)

## 2024-12-28 LAB — VITAMIN D 25 HYDROXY (VIT D DEFICIENCY, FRACTURES): VITD: 30.29 ng/mL (ref 30.00–100.00)

## 2024-12-28 LAB — LIPID PANEL
Cholesterol: 154 mg/dL (ref 28–200)
HDL: 36.4 mg/dL — ABNORMAL LOW
LDL Cholesterol: 97 mg/dL (ref 10–99)
NonHDL: 117.65
Total CHOL/HDL Ratio: 4
Triglycerides: 105 mg/dL (ref 10.0–149.0)
VLDL: 21 mg/dL (ref 0.0–40.0)

## 2025-01-03 ENCOUNTER — Ambulatory Visit: Payer: Self-pay

## 2025-02-05 ENCOUNTER — Other Ambulatory Visit

## 2025-08-06 ENCOUNTER — Other Ambulatory Visit

## 2025-08-08 ENCOUNTER — Ambulatory Visit: Admitting: Oncology

## 2025-08-08 ENCOUNTER — Other Ambulatory Visit

## 2025-12-24 ENCOUNTER — Encounter
